# Patient Record
Sex: Female | Born: 1990 | Race: White | Hispanic: No | Marital: Married
Health system: Southern US, Community
[De-identification: ages and names within clinical notes are randomized; demographics above are authoritative.]

## PROBLEM LIST (undated history)

## (undated) DIAGNOSIS — N200 Calculus of kidney: Secondary | ICD-10-CM

## (undated) DIAGNOSIS — O09299 Supervision of pregnancy with other poor reproductive or obstetric history, unspecified trimester: Secondary | ICD-10-CM

## (undated) DIAGNOSIS — F419 Anxiety disorder, unspecified: Secondary | ICD-10-CM

## (undated) HISTORY — PX: TONSILLECTOMY: SUR1361

## (undated) HISTORY — DX: Calculus of kidney: N20.0

## (undated) HISTORY — DX: Anxiety disorder, unspecified: F41.9

---
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
## 2014-07-06 ENCOUNTER — Emergency Department (HOSPITAL_COMMUNITY)
Admission: EM | Admit: 2014-07-06 | Discharge: 2014-07-06 | Disposition: A | Discharge status: Home / Self Care | Payer: BC Managed Care – PPO | Attending: Emergency Medicine | Admitting: Emergency Medicine

## 2014-07-06 ENCOUNTER — Encounter (HOSPITAL_COMMUNITY): Payer: Self-pay

## 2014-07-06 ENCOUNTER — Emergency Department (HOSPITAL_COMMUNITY): Payer: BC Managed Care – PPO

## 2014-07-06 DIAGNOSIS — R109 Unspecified abdominal pain: Secondary | ICD-10-CM | POA: Diagnosis present

## 2014-07-06 DIAGNOSIS — N201 Calculus of ureter: Secondary | ICD-10-CM | POA: Diagnosis not present

## 2014-07-06 DIAGNOSIS — Z3202 Encounter for pregnancy test, result negative: Secondary | ICD-10-CM | POA: Diagnosis not present

## 2014-07-06 DIAGNOSIS — N23 Unspecified renal colic: Secondary | ICD-10-CM | POA: Insufficient documentation

## 2014-07-06 LAB — URINALYSIS, ROUTINE W REFLEX MICROSCOPIC
BILIRUBIN URINE: NEGATIVE
GLUCOSE, UA: NEGATIVE mg/dL
Hgb urine dipstick: NEGATIVE
KETONES UR: NEGATIVE mg/dL
LEUKOCYTES UA: NEGATIVE
NITRITE: NEGATIVE
PH: 7 (ref 5.0–8.0)
PROTEIN: NEGATIVE mg/dL
SPECIFIC GRAVITY, URINE: 1.025 (ref 1.005–1.030)
Urobilinogen, UA: 0.2 mg/dL (ref 0.0–1.0)

## 2014-07-06 LAB — PREGNANCY, URINE: Preg Test, Ur: NEGATIVE

## 2014-07-06 MED ORDER — NAPROXEN 250 MG PO TABS: 250.0000 mg | ORAL_TABLET | Freq: Two times a day (BID) | ORAL | Status: DC | PRN

## 2014-07-06 MED ORDER — HYDROCODONE-ACETAMINOPHEN 5-325 MG PO TABS
ORAL_TABLET | ORAL | Status: DC
Start: 2014-07-06 — End: 2017-07-16

## 2014-07-06 MED ORDER — TAMSULOSIN HCL 0.4 MG PO CAPS: 0.4000 mg | ORAL_CAPSULE | Freq: Every day | ORAL | Status: DC

## 2014-07-06 MED ORDER — ONDANSETRON HCL 4 MG PO TABS: 4.0000 mg | ORAL_TABLET | Freq: Three times a day (TID) | ORAL | Status: DC | PRN

## 2014-07-06 MED ORDER — HYDROCODONE-ACETAMINOPHEN 5-325 MG PO TABS
2.0000 | ORAL_TABLET | Freq: Once | ORAL | Status: AC
  Administered 2014-07-06: 2 via ORAL
  Filled 2014-07-06: qty 2

## 2014-07-06 NOTE — ED Notes (Signed)
MD at bedside. 

## 2014-07-06 NOTE — ED Notes (Signed)
Pt reporting pain in lower abdomen "like period cramps, but worse".  Reporting some pain in right flank as well.  Reporting urgency and frequency but doesn't feel as though she empties her bladder.  Reporting some mild nausea as well.

## 2014-07-06 NOTE — Discharge Instructions (Signed)
°Emergency Department Resource Guide °1) Find a Doctor and Pay Out of Pocket °Although you won't have to find out who is covered by your insurance plan, it is a good idea to ask around and get recommendations. You will then need to call the office and see if the doctor you have chosen will accept you as a new patient and what types of options they offer for patients who are self-pay. Some doctors offer discounts or will set up payment plans for their patients who do not have insurance, but you will need to ask so you aren't surprised when you get to your appointment. ° °2) Contact Your Local Health Department °Not all health departments have doctors that can see patients for sick visits, but many do, so it is worth a call to see if yours does. If you don't know where your local health department is, you can check in your phone book. The CDC also has a tool to help you locate your state's health department, and many state websites also have listings of all of their local health departments. ° °3) Find a Walk-in Clinic °If your illness is not likely to be very severe or complicated, you may want to try a walk in clinic. These are popping up all over the country in pharmacies, drugstores, and shopping centers. They're usually staffed by nurse practitioners or physician assistants that have been trained to treat common illnesses and complaints. They're usually fairly quick and inexpensive. However, if you have serious medical issues or chronic medical problems, these are probably not your best option. ° °No Primary Care Doctor: °- Call [facility] at  832-8000 - they can help you locate a primary care doctor that  accepts your insurance, provides certain services, etc. °- Physician Referral Service- 1-800-533-3463 ° °Chronic Pain Problems: °Organization         Address  Phone   Notes  °[facility]  (336) 297-2271 Patients need to be referred by their primary care doctor.  ° °Medication  Assistance: °Organization         Address  Phone   Notes  °[place] [facility] [address] °[place], [place] [address] (336) 641-8030 --Must be a resident of [place] °-- Must have NO insurance coverage whatsoever (no Medicaid/ Medicare, etc.) °-- The pt. MUST have a primary care doctor that directs their care regularly and follows them in the community °  °[facility]  (866) 331-1348   °[employer]  (888) 892-1162   ° °Agencies that provide inexpensive medical care: °Organization         Address  Phone   Notes  °[facility]  (336) 832-8035   °[facility]    (336) 832-7272   °[facility] [address] °[place], [place] [address] (336) 832-4777   °[facility] [address], °[place] (336) 271-4999   °[facility]    (336) 373-0678   °[facility]    (336) 272-1050   °[facility] ° [address], [place] Phone:  (336) 832-4444, Fax:  (336) 832-4440 Hours of Operation:  9 am - 6 pm, M-F.  Also accepts Medicaid/Medicare and self-pay.  °[facility] ° [address], [place] Phone: (336) 832-3150, Fax: (336) 832-3151. Hours of Operation:  8:30 am - 5:30 pm, M-F.  Also accepts Medicaid and self-pay.  °[facility] [place] [address], [place] Phone: (336) 878-6027   °[facility] [address], [place], [place] (336)723-1848, Ext. 123 Mondays & Thursdays: 7-9 AM.  First 15 patients are seen on a first come, first serve basis. °  ° °Medicaid-accepting [place] Providers: ° °Organization         Address  Phone   Notes  °[facility] [address], [place] (336) 641-2100 Also accepts self-pay patients.  °[facility] [address], [place] ° (336) 856-9996   °[facility] [address], [place]  (336) 288-8857   °[facility] [address] [place] [address], [place] (336) 299-7000   °Veita Bland [address], [place]  ° (336) 373-1557 Only accepts [place] Access Medicaid patients after they have their name applied to their card.  ° °Self-Pay (no insurance) in [place]: ° °Organization         Address  Phone   Notes  °Sickle Cell Patients, [facility] [address], [place] (336) 832-1970   °[facility] [address], [place] (336) 832-4400   °[facility] ° [address], [place] (336) 992-4800   °[facility]/Dr. Osei-Bonsu ° [address] [place] [address], [place] or [address], [place] (336) 841-8500 Phone number for both [place] and [place] locations is the same.  °[facility] [address], [place] (336) 299-0000   °[facility] [address] [place] [address], [place] or [address] (336) 852-7530 °(336) 878-2260   °[facility] [address], [place] (336) 350-1642, phone; (336) 294-5005, fax Sees patients 1st and 3rd Saturday of every month.  Must not qualify for public or private insurance (i.e. Medicaid, Medicare, [place] Health Choice, Veterans' Benefits) • Household income should be no more than 200% of the poverty level •The clinic cannot treat you if you are pregnant or think you are pregnant • Sexually transmitted diseases are not treated at the clinic.  ° ° °Dental Care: °Organization         Address  Phone  Notes  °[place] [facility] [address], [place] (336) 641-6152 Accepts children up to age 21 who are enrolled in Medicaid or [place] Health Choice; pregnant women with a Medicaid card; and children who have applied for Medicaid or [place] Health Choice, but were declined, whose parents can pay a reduced fee at time of service.  °[place]  [facility] [place]  [address], [place] (336) 641-7733 Accepts children up to age 21 who are enrolled in Medicaid or [place] Health Choice; pregnant women with a Medicaid card; and children who have applied for Medicaid or [place] Health Choice, but were declined, whose parents can pay a reduced fee at time of service.  °[facility] ° [address], [place] (336) 641-4533 Patients are seen by appointment only. Walk-ins are not accepted. [facility] will see patients 18 years of age and older. °Monday - Tuesday (8am-5pm) °Most Wednesdays (8:30-5pm) °$30 per visit, cash only  °[facility] ° [address], [place] (336) 641-4533 Patients are seen by appointment only. Walk-ins are not accepted. [facility] will see patients 18 years of age and older. °One   Wednesday Evening (Monthly: Volunteer Based).  $30 per visit, cash only  °[facility]  (919) 537-3737 for adults; Children under age 4, call Graduate Pediatric Dentistry at ([phone]) [phone]. Children aged 4-14, please call (919) [phone] to request a pediatric application. ° Dental services are provided in all areas of dental care including fillings, crowns and bridges, complete and partial dentures, implants, gum treatment, root canals, and extractions. Preventive care is also provided. Treatment is provided to both adults and children. °Patients are selected via a lottery and there is often a waiting list. °  °[facility] [address], °[place] ° (336) 763-8833 [email or web address] °  °[facility] [address], [place], [place] (336)723-1848, Ext. 123 Second and Fourth Thursday of each month, opens at 6:30 AM; Clinic ends at 9 AM.  Patients are seen on a first-come first-served basis, and a limited number are seen during each clinic.  ° °[facility] ° [address], [place], [place] (336) 723-7904    Eligibility Requirements °You must have lived in [place], or [place] counties for at least the last three months. °  You cannot be eligible for state or federal sponsored healthcare insurance, including Veterans Administration, Medicaid, or Medicare. °  You generally cannot be eligible for healthcare insurance through your employer.  °  How to apply: °Eligibility screenings are held every Tuesday and Wednesday afternoon from 1:00 pm until 4:00 pm. You do not need an appointment for the interview!  °[facility] [address], [place], [place] 336-631-2330   °[facility]  336-342-8273   °[facility]  336-703-3100   °[facility]  336-570-6415   ° °Behavioral Health Resources in the Community: °Intensive Outpatient Programs °Organization         Address  Phone  Notes  °[facility] [address], [place], [place] 336-878-6098   °[facility] [address], [place], [place] 336-832-9800   °[facility] [address], [place], [place] ° 336-882-2125   °[facility] [address],  °[place], [place] 1-800-853-5163 or 336-641-4981   °Substance Abuse Resources °Organization         Address  Phone  Notes  °[facility]  336-882-2125   °[facility]  336-784-9470   °[facility]  336-285-9073   °[facility]  336-845-3988   °Residential & Outpatient Substance Abuse Program  1-800-659-3381   °Psychological Services °Organization         Address  Phone  Notes  °[facility]  336- 832-9600   °[facility]  336- 378-7881   °[facility] [address], [place] 1-800-853-5163 or 336-641-4981   ° °Mobile Crisis Teams °Organization         Address  Phone  Notes  °[facility], Mobile Crisis Care Unit  1-877-626-1772   °Assertive °[facility] ° [address]  [place], [place] 336-834-9664   °Sharon DeEsch [address] °[place] 336-554-5454   ° °Self-Help/Support Groups °Organization         Address  Phone             Notes  °[facility]  - variety of support groups  336- 373-1402 Call for more information  °Narcotics Anonymous (NA), [facility] [address], °[place]  2 meetings at this location  ° °  Residential Treatment Programs °Organization         Address  Phone  Notes  °[facility] [address],    °[place]  1-866-801-8205   °[facility] ° [address], [place], [place] 704-293-8524   °[facility] [address], [place] 336-845-3988 Admissions: 8am-3pm M-F  °[facility] [address]    °[place], [place] 336-841-1104   °[facility] [address], [place], [place] 336-379-7146   °[facility] [address]  °[place], [place] 336-285-9073   °[facility] Intensive Outpatient [address], [place], [place] 336-852-3033   °[facility] [address]  °[place], [place] 1-877-615-2722 or 336-784-9470   °[facility] [address] [place], [place] 336-227-7417 Accepts Medicaid  °[facility] [address]  °[place] 1-800-659-3381 Substance Abuse/Addiction Treatment  ° °[place] Behavioral Health Resources °Organization         Address  Phone  Notes  °[facility]  (888) 581-9988   °Julie Brannon, PhD [address] [place], [place]   (336) 349-5553 or (336) 951-0000   °[facility]   [address] °[place], [place] (336) 349-4454   °[facility] [address], [place], [place] (336) 342-8316 Insurance/Medicaid/sponsorship through [facility]  °[facility] [address]                                    [place], [place] (336) 342-8316 Therapy/tele-psych/case    °[facility] [address]  ° [place], [place] (336) 349-2233    °Dr. Arfeen  (336) 349-4544   °[facility] [place]  [employer] [place] [facility] 1) [address], [place] °2) [address], [place] °3)  [address], [place] (336) 349-3220 °(336) 342-7768 ° °(336) 342-8140   °[place] [facility] (336) 342-1394 or (336) 342-3537 (After Hours)    ° ° ° °Take the prescriptions as directed.  Call the Urologist today to schedule a follow up appointment within the next week.  Return to the Emergency Department immediately if worsening. ° °

## 2014-07-06 NOTE — ED Provider Notes (Signed)
CSN: [id number]     Arrival date & time 07/06/14  [id number] History   First MD Initiated Contact with Patient 07/06/14 [phone]     Chief Complaint  Patient presents with  . Abdominal Pain  . Flank Pain     HPI Pt was seen at 0720. Per pt, c/o sudden onset and persistence of constant right sided flank "pain" that began at 0200 overnight last night.  Pt describes the pain as "sharp," "aching," and radiating into the right side of her abd.  Has been associated with urinary frequency.  Denies vaginal bleeding/discharge, no dysuria/hematuria, no abd pain, no diarrhea, no black or blood in emesis, no CP/SOB.     History reviewed. No pertinent past medical history.   Past Surgical History  Procedure Laterality Date  . Tonsillectomy      History  Substance Use Topics  . Smoking status: Never Smoker   . Smokeless tobacco: Not on file  . Alcohol Use: No    Review of Systems ROS: Statement: All systems negative except as marked or noted in the HPI; Constitutional: Negative for fever and chills. ; ; Eyes: Negative for eye pain, redness and discharge. ; ; ENMT: Negative for ear pain, hoarseness, nasal congestion, sinus pressure and sore throat. ; ; Cardiovascular: Negative for chest pain, palpitations, diaphoresis, dyspnea and peripheral edema. ; ; Respiratory: Negative for cough, wheezing and stridor. ; ; Gastrointestinal: Negative for nausea, vomiting, diarrhea, abdominal pain, blood in stool, hematemesis, jaundice and rectal bleeding. . ; ; Genitourinary: +flank pain, urinary frequency. Negative for dysuria and hematuria. ; ; GYN:  No vaginal bleeding, no vaginal discharge, no vulvar pain. ;; Musculoskeletal: Negative for back pain and neck pain. Negative for swelling and trauma.; ; Skin: Negative for pruritus, rash, abrasions, blisters, bruising and skin lesion.; ; Neuro: Negative for headache, lightheadedness and neck stiffness. Negative for weakness, altered level of consciousness , altered mental  status, extremity weakness, paresthesias, involuntary movement, seizure and syncope.      Allergies  Prednisone and Pseudophed-chlophedianol-gg  Home Medications   Prior to Admission medications   Not on File   BP 125/68 mmHg  Pulse 92  Temp(Src) 97.5 F (36.4 C) (Oral)  Resp 20  Ht 5\' 2"  (1.575 m)  Wt 160 lb (72.576 kg)  BMI 29.26 kg/m2  SpO2 100%  LMP 06/26/2014 Physical Exam 0725: Physical examination:  Nursing notes reviewed; Vital signs and O2 SAT reviewed;  Constitutional: Well developed, Well nourished, Well hydrated, In no acute distress; Head:  Normocephalic, atraumatic; Eyes: EOMI, PERRL, No scleral icterus; ENMT: Mouth and pharynx normal, Mucous membranes moist; Neck: Supple, Full range of motion, No lymphadenopathy; Cardiovascular: Regular rate and rhythm, No murmur, rub, or gallop; Respiratory: Breath sounds clear & equal bilaterally, No rales, rhonchi, wheezes.  Speaking full sentences with ease, Normal respiratory effort/excursion; Chest: Nontender, Movement normal; Abdomen: Soft, Nontender, Nondistended, Normal bowel sounds; Genitourinary: No CVA tenderness; Spine:  No midline CS, TS, LS tenderness. +mild TTP right lumbar paraspinal muscles.;; Extremities: Pulses normal, No tenderness, No edema, No calf edema or asymmetry.; Neuro: AA&Ox3, Major CN grossly intact.  Speech clear. No gross focal motor or sensory deficits in extremities.; Skin: Color normal, Warm, Dry.   ED Course  Procedures     EKG Interpretation None      MDM  MDM Reviewed: previous chart, nursing note and vitals Interpretation: labs and CT scan    Results for orders placed or performed during the hospital encounter of 07/06/14  Urinalysis, Routine  w reflex microscopic  Result Value Ref Range   Color, Urine YELLOW YELLOW   APPearance CLEAR CLEAR   Specific Gravity, Urine 1.025 1.005 - 1.030   pH 7.0 5.0 - 8.0   Glucose, UA NEGATIVE NEGATIVE mg/dL   Hgb urine dipstick NEGATIVE NEGATIVE    Bilirubin Urine NEGATIVE NEGATIVE   Ketones, ur NEGATIVE NEGATIVE mg/dL   Protein, ur NEGATIVE NEGATIVE mg/dL   Urobilinogen, UA 0.2 0.0 - 1.0 mg/dL   Nitrite NEGATIVE NEGATIVE   Leukocytes, UA NEGATIVE NEGATIVE  Pregnancy, urine  Result Value Ref Range   Preg Test, Ur NEGATIVE NEGATIVE   Ct Renal Stone Study 07/06/2014   CLINICAL DATA:  24 year old female with right flank pain since this morning. Initial encounter.  EXAM: CT ABDOMEN AND PELVIS WITHOUT CONTRAST  TECHNIQUE: Multidetector CT imaging of the abdomen and pelvis was performed following the standard protocol without IV contrast.  COMPARISON:  None.  FINDINGS: 2.6 mm stone at the right ureterovesical junction or just above the right ureterovesical junction causing moderate right-sided hydroureteronephrosis.  Although the distal aspect of the appendix is prominent in size, no extra luminal inflammatory process noted.  No free intraperitoneal air or free fluid.  Tiny hyperdensity anterior cortex of the left kidney may be artifact or possibly a tiny hyperdense cysts. No left-sided obstructing stone or hydronephrosis.  Taking into account limitation by non contrast imaging, no worrisome hepatic, splenic, pancreatic or adrenal abnormality. No calcified gallstones.  Lung bases are clear.  No osseous abnormality.  Uterus and adnexa unremarkable for patient's age.  No abdominal aortic aneurysm.  No adenopathy.  IMPRESSION: 2.6 mm stone at the right ureterovesical junction (or just above the right ureterovesical junction) causing moderate right-sided hydroureteronephrosis.  Although the distal aspect of the appendix is prominent in size, no extra luminal inflammatory process noted.  Tiny hyperdensity anterior cortex of the left kidney may be artifact or possibly a tiny hyperdense cysts. No left-sided obstructing stone or hydronephrosis.  Please see above.   Electronically Signed   By: Bridgett Larsson M.D.   On: 07/06/2014 08:21    0900:  Improved after  meds. Pt wants to go home now. Tol PO well while in the ED without N/V. Dx and testing d/w pt and family.  Questions answered.  Verb understanding, agreeable to d/c home with outpt f/u.   Samuel Jester, DO 07/10/14 [phone]

## 2014-07-06 NOTE — ED Notes (Signed)
Patient would like something for pain. RN aware.

## 2014-07-06 NOTE — ED Notes (Signed)
Pt  Reports pain in right side/flank/abd onset 2 am, states it feels like the last time when she had a kidney infection

## 2015-09-30 ENCOUNTER — Other Ambulatory Visit (HOSPITAL_COMMUNITY): Payer: Self-pay | Admitting: *Deleted

## 2015-09-30 ENCOUNTER — Other Ambulatory Visit (HOSPITAL_COMMUNITY): Payer: Self-pay | Admitting: Nurse Practitioner

## 2015-09-30 DIAGNOSIS — E049 Nontoxic goiter, unspecified: Secondary | ICD-10-CM

## 2015-10-06 ENCOUNTER — Ambulatory Visit (HOSPITAL_COMMUNITY)
Admission: RE | Admit: 2015-10-06 | Discharge: 2015-10-06 | Disposition: A | Discharge status: Home / Self Care | Payer: BC Managed Care – PPO | Source: Ambulatory Visit | Attending: Nurse Practitioner | Admitting: Nurse Practitioner

## 2015-10-06 DIAGNOSIS — E049 Nontoxic goiter, unspecified: Secondary | ICD-10-CM | POA: Diagnosis present

## 2015-10-06 DIAGNOSIS — E042 Nontoxic multinodular goiter: Secondary | ICD-10-CM | POA: Diagnosis not present

## 2017-07-16 ENCOUNTER — Ambulatory Visit: Payer: BC Managed Care – PPO | Admitting: Certified Nurse Midwife

## 2017-07-16 ENCOUNTER — Encounter: Payer: Self-pay | Admitting: Certified Nurse Midwife

## 2017-07-16 VITALS — BP 119/64 | HR 86 | Ht 62.0 in | Wt 195.6 lb

## 2017-07-16 DIAGNOSIS — N946 Dysmenorrhea, unspecified: Secondary | ICD-10-CM | POA: Insufficient documentation

## 2017-07-16 DIAGNOSIS — Z30011 Encounter for initial prescription of contraceptive pills: Secondary | ICD-10-CM

## 2017-07-16 LAB — POCT URINE PREGNANCY: Preg Test, Ur: NEGATIVE

## 2017-07-16 MED ORDER — NORGESTIM-ETH ESTRAD TRIPHASIC 0.18/0.215/0.25 MG-35 MCG PO TABS: 1.0000 | ORAL_TABLET | Freq: Every day | ORAL | 11 refills | Status: DC

## 2017-07-16 NOTE — Progress Notes (Signed)
New pt is here with c/o very painful periods resulting in lightheadedness that is affecting work. Also c/o anxiety. 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 Pt has never had a GYN exam. Is not using birth control.

## 2017-07-16 NOTE — Patient Instructions (Signed)
Oral Contraception Use Oral contraceptive pills (OCPs) are medicines taken to prevent pregnancy. OCPs work by preventing the ovaries from releasing eggs. The hormones in OCPs also cause the cervical mucus to thicken, preventing the sperm from entering the uterus. The hormones also cause the uterine lining to become thin, not allowing a fertilized egg to attach to the inside of the uterus. OCPs are highly effective when taken exactly as prescribed. However, OCPs do not prevent sexually transmitted diseases (STDs). Safe sex practices, such as using condoms along with an OCP, can help prevent STDs. Before taking OCPs, you may have a physical exam and Pap test. Your health care provider may also order blood tests if necessary. Your health care provider will make sure you are a good candidate for oral contraception. Discuss with your health care provider the possible side effects of the OCP you may be prescribed. When starting an OCP, it can take 2 to 3 months for the body to adjust to the changes in hormone levels in your body. How to take oral contraceptive pills Your health care provider may advise you on how to start taking the first cycle of OCPs. Otherwise, you can:  Start on day 1 of your menstrual period. You will not need any backup contraceptive protection with this start time.  Start on the first Sunday after your menstrual period or the day you get your prescription. In these cases, you will need to use backup contraceptive protection for the first week.  Start the pill at any time of your cycle. If you take the pill within 5 days of the start of your period, you are protected against pregnancy right away. In this case, you will not need a backup form of birth control. If you start at any other time of your menstrual cycle, you will need to use another form of birth control for 7 days. If your OCP is the type called a minipill, it will protect you from pregnancy after taking it for 2 days (48  hours).  After you have started taking OCPs:  If you forget to take 1 pill, take it as soon as you remember. Take the next pill at the regular time.  If you miss 2 or more pills, call your health care provider because different pills have different instructions for missed doses. Use backup birth control until your next menstrual period starts.  If you use a 28-day pack that contains inactive pills and you miss 1 of the last 7 pills (pills with no hormones), it will not matter. Throw away the rest of the non-hormone pills and start a new pill pack.  No matter which day you start the OCP, you will always start a new pack on that same day of the week. Have an extra pack of OCPs and a backup contraceptive method available in case you miss some pills or lose your OCP pack. Follow these instructions at home:  Do not smoke.  Always use a condom to protect against STDs. OCPs do not protect against STDs.  Use a calendar to mark your menstrual period days.  Read the information and directions that came with your OCP. Talk to your health care provider if you have questions. Contact a health care provider if:  You develop nausea and vomiting.  You have abnormal vaginal discharge or bleeding.  You develop a rash.  You miss your menstrual period.  You are losing your hair.  You need treatment for mood swings or depression.  You   get dizzy when taking the OCP.  You develop acne from taking the OCP.  You become pregnant. Get help right away if:  You develop chest pain.  You develop shortness of breath.  You have an uncontrolled or severe headache.  You develop numbness or slurred speech.  You develop visual problems.  You develop pain, redness, and swelling in the legs. This information is not intended to replace advice given to you by your health care provider. Make sure you discuss any questions you have with your health care provider. Document Released: 05/17/2011 Document  Revised: 11/03/2015 Document Reviewed: 11/16/2012 Elsevier Interactive Patient Education  2017 [employer]  

## 2017-07-16 NOTE — Progress Notes (Signed)
GYN ENCOUNTER NOTE  Subjective:       Janet Norman is a 27 y.o. G0P0000 female is here for gynecologic evaluation of the following issues:  1. She complains of painful periods. Her cycles are typically 35 days. Lasting 4-5 days. She has moderate bleeding changing her pad every 2 hours with occasoinally passing pea size clots. She states that a few days before her cycle starts she has tender swollen breast, she gets light headed/dizzy and has significant pain until day 2. She treats the pain with 800 mg of ibuprofen which usually works. She states that she has always had this problem and took birth control pills in middle school to help control symptoms. It worked well. She stopped taking them due to indigestion and started depo injections which she did not like. She has been off of birth control for 4 yrs.     Gynecologic History Patient's last menstrual period was 06/14/2017 (exact date). Contraception: none Last Pap: has not had one.   Obstetric History OB History  Gravida Para Term Preterm AB Living  0 0 0 0 0 0  SAB TAB Ectopic Multiple Live Births  0 0 0 0 0        No past medical history on file.  Past Surgical History:  Procedure Laterality Date  . TONSILLECTOMY      No current outpatient medications on file prior to visit.   No current facility-administered medications on file prior to visit.     Allergies  Allergen Reactions  . Red Dye   . Prednisone Hives and Rash  . Pseudophed-Chlophedianol-Gg Swelling and Rash    Social History   Socioeconomic History  . Marital status: Single    Spouse name: Not on file  . Number of children: Not on file  . Years of education: Not on file  . Highest education level: Not on file  Social Needs  . Financial resource strain: Not on file  . Food insecurity - worry: Not on file  . Food insecurity - inability: Not on file  . Transportation needs - medical: Not on file  . Transportation needs - non-medical: Not on file   Occupational History  . Not on file  Tobacco Use  . Smoking status: Never Smoker  . Smokeless tobacco: Never Used  Substance and Sexual Activity  . Alcohol use: No  . Drug use: No  . Sexual activity: Yes    Birth control/protection: None  Other Topics Concern  . Not on file  Social History Narrative  . Not on file    Family History  Problem Relation Age of Onset  . Diabetes Mother   . Cancer Maternal Grandmother    Denies history of endometriosis for mother or siblings   The following portions of the patient's history were reviewed and updated as appropriate: allergies, current medications, past family history, past medical history, past social history, past surgical history and problem list.  Review of Systems Review of Systems - Negative except as mentioned in HPI Review of Systems - General ROS: negative for - chills, fatigue, fever, hot flashes, malaise or night sweats. Positive for dizziness with cycle Hematological and Lymphatic ROS: negative for - bleeding problems or swollen lymph nodes Gastrointestinal ROS: negative for - abdominal pain, blood in stools, change in bowel habits and nausea/vomiting. Positive for change in bowel habits during menstrual cycle.  Musculoskeletal ROS: negative for - joint pain, muscle pain or muscular weakness Genito-Urinary ROS: negative for - change in menstrual  cycle, dyspareunia, dysuria, genital discharge, genital ulcers, hematuria, incontinence, irregular menses, nocturia or pelvic pain. Positive for dysmenorrhea and moderate to heavy bleeding.   Objective:   BP 119/64   Pulse 86   Ht 5\' 2"  (1.575 m)   Wt 195 lb 9 oz (88.7 kg)   LMP 06/14/2017 (Exact Date)   BMI 35.77 kg/m  CONSTITUTIONAL: Well-developed, well-nourished female in no acute distress.  HENT:  Normocephalic, atraumatic.  NECK: Normal range of motion, supple.  SKIN: Skin is warm and dry. No rash noted. Not diaphoretic. No erythema. No pallor. NEUROLGIC: Alert and  oriented to person, place, and time. PSYCHIATRIC: Normal mood and affect. Normal behavior. Normal judgment and thought content. CARDIOVASCULAR:Not Examined RESPIRATORY: Not Examined BREASTS: Not Examined ABDOMEN: Soft, non distended; Non tender.  No Organomegaly. PELVIC:pt declines MUSCULOSKELETAL: Normal range of motion. No tenderness.  No cyanosis, clubbing, or edema.   Assessment:   Ddysmenorrhea Plan:   Pt declines full work up of labs and ultrasound at this time. She is more interested in treating the symptoms with birth control pills. She denies any contraindications for use of the pill. Order placed for tri-sprintec. Discussed use. She verbalizes understanding and agrees to plan. Follow up PRN.   I attest more than 50% of visit spent reviewing patient history, discussing options for full work up to evaluate for endometriosis, fibroids, discussed labs and ultrasound and use of birth control for treating symptoms. Face to face time 15 minutes.   Doreene BurkeAnnie Kaelyn Nauta, CNM

## 2017-07-17 ENCOUNTER — Encounter: Payer: Self-pay | Admitting: Certified Nurse Midwife

## 2017-11-27 IMAGING — US US SOFT TISSUE HEAD/NECK
1 series · 14 of 25 positions shown · non-contrast
Comparison: None.

CLINICAL DATA: Goiter 5 physical exam

EXAM:
THYROID ULTRASOUND
TECHNIQUE: Ultrasound examination of the thyroid gland and adjacent soft
tissues was performed.

[Series 1: us soft tissue head/neck · 0.06mm/px · 14 of 59 slices shown]
[im 1/59]
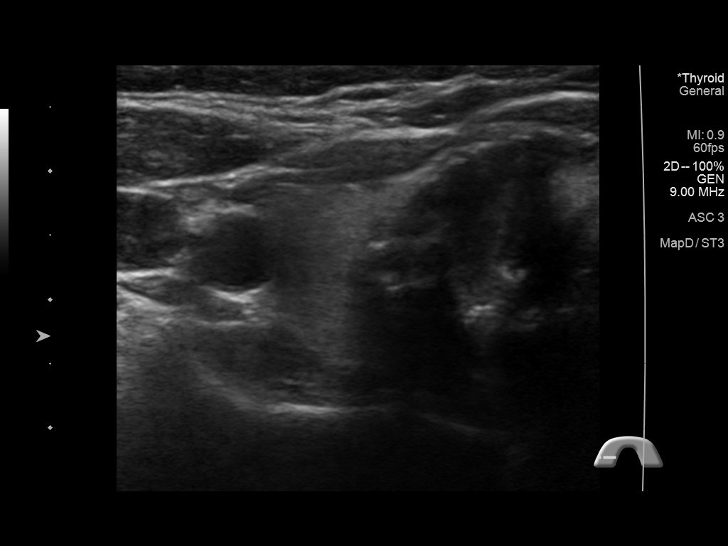
[im 5/59]
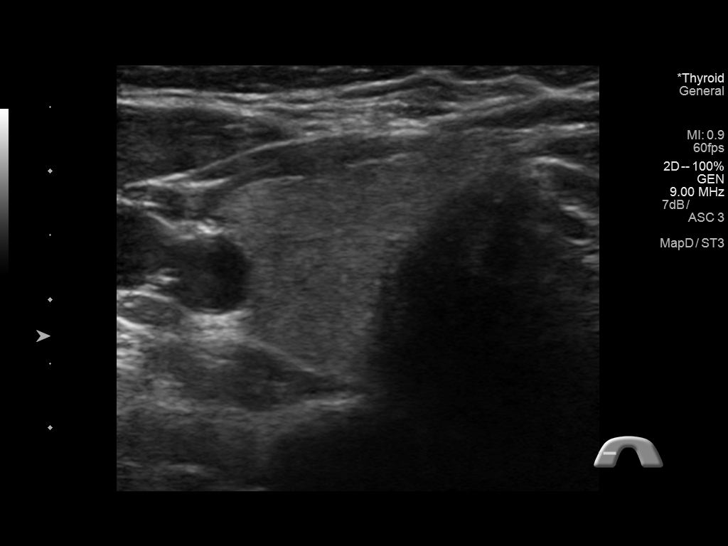
[im 10/59]
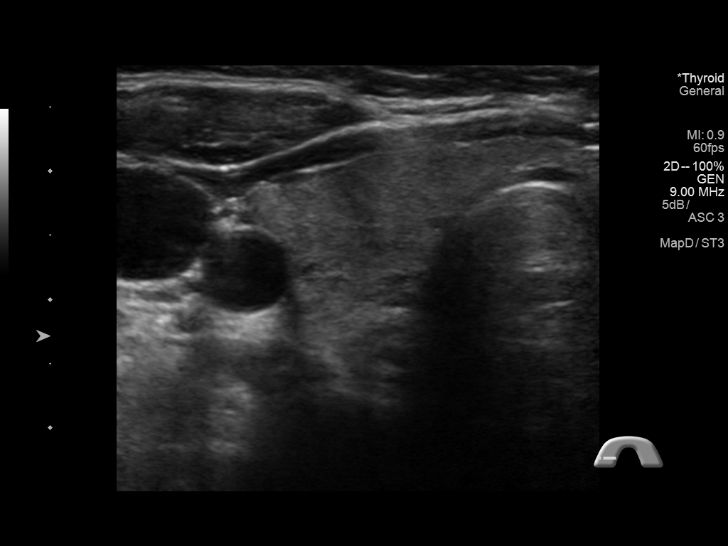
[im 15/59]
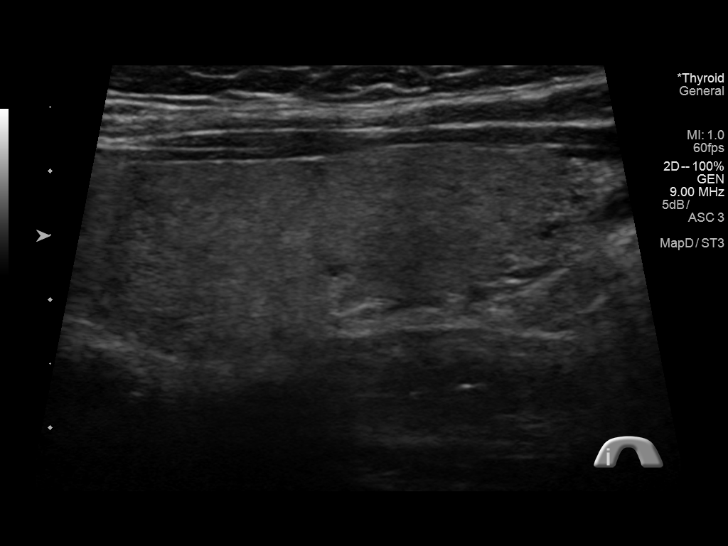
[im 20/59]
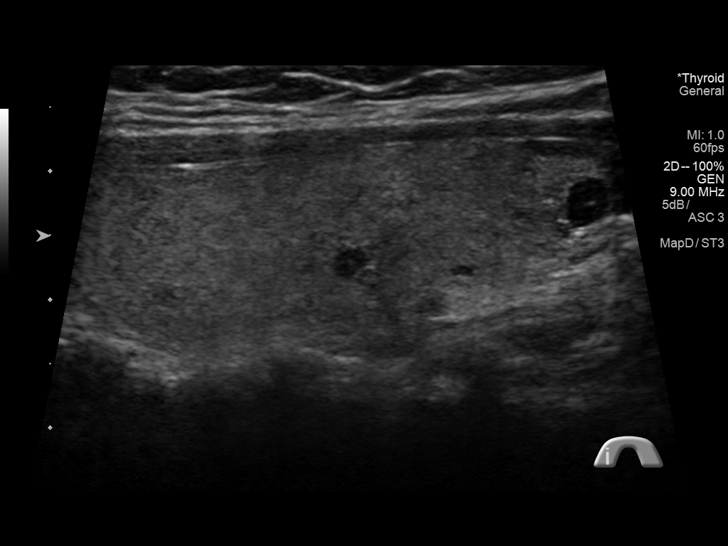
[im 22/59]
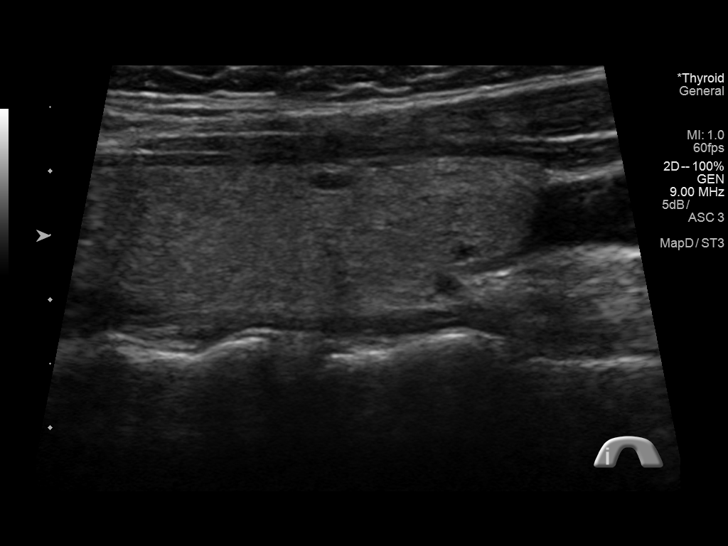
[im 27/59]
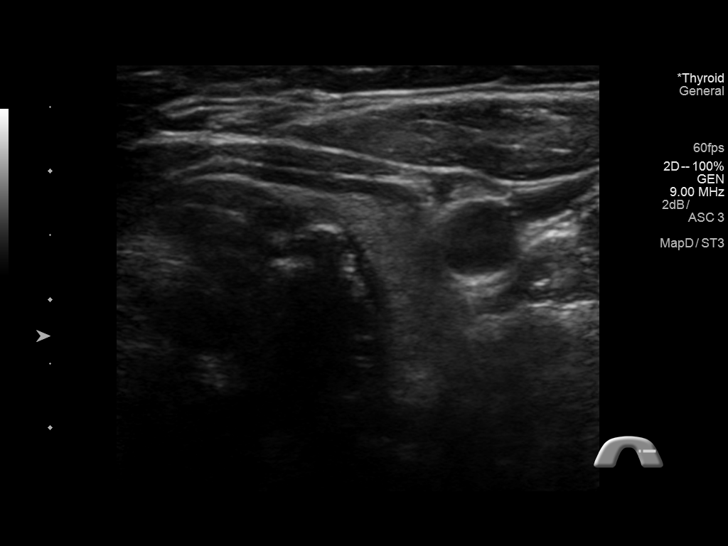
[im 32/59]
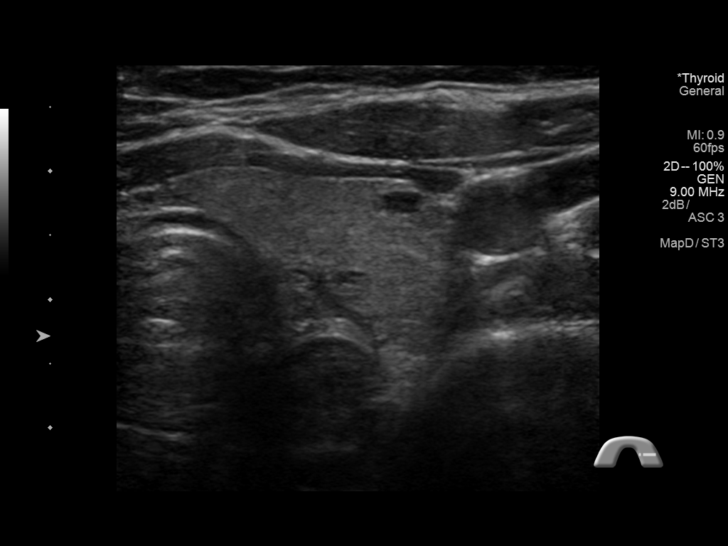
[im 37/59]
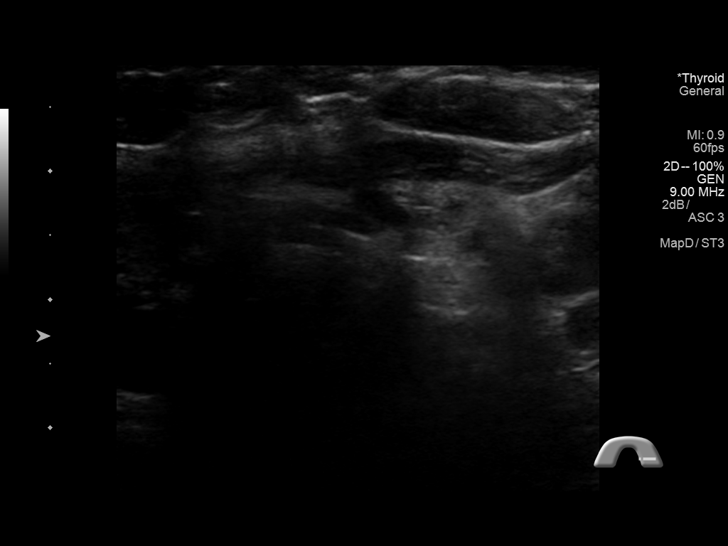
[im 39/59]
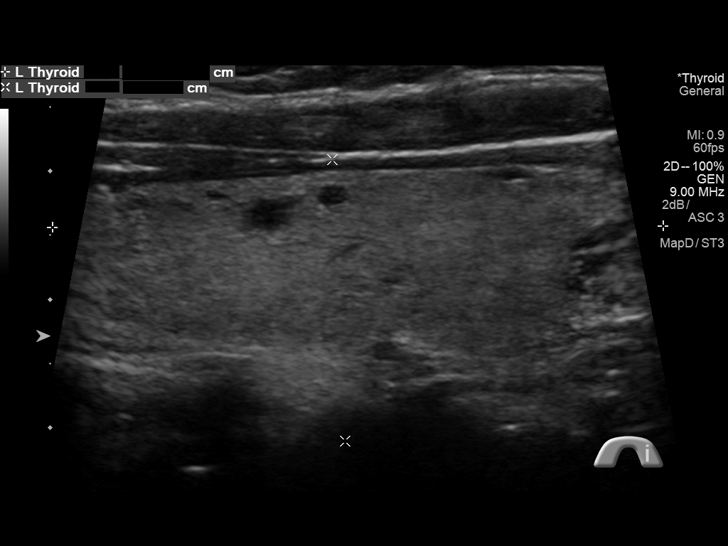
[im 44/59]
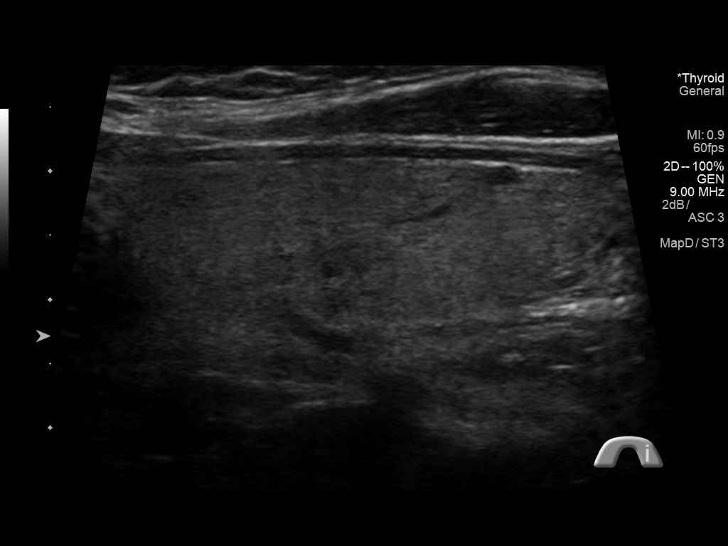
[im 49/59]
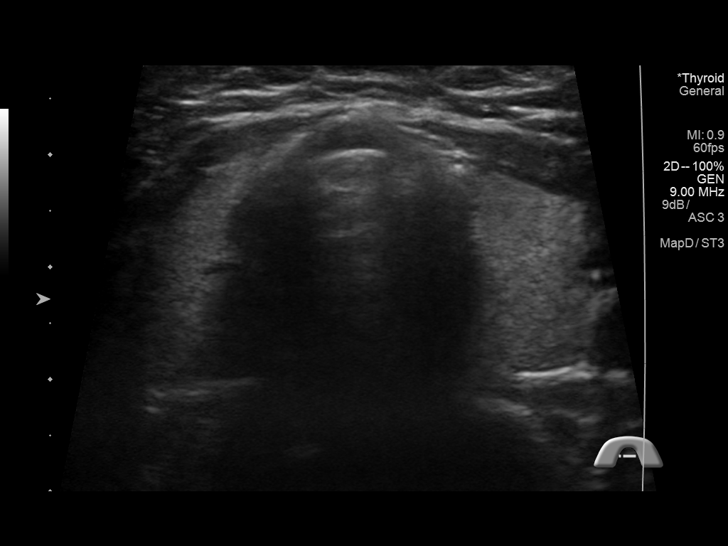
[im 54/59]
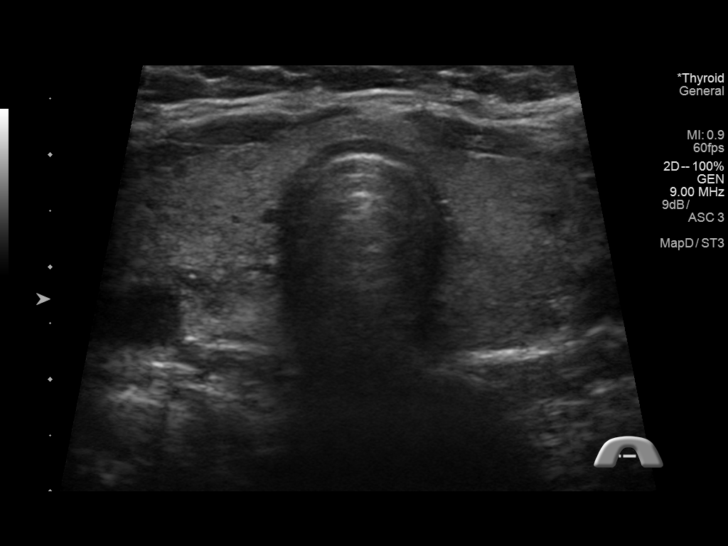
[im 59/59]
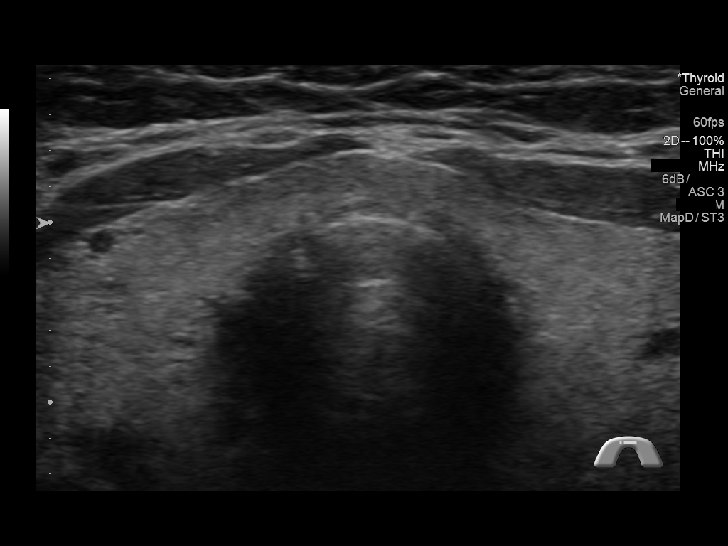

[14 of 25 positions shown; findings below may reference images not displayed]

FINDINGS: Right thyroid lobe

Measurements: 4.6 x 1.7 x 2.2 cm. Ill-defined lower pole new solid
nodule measures 1.3 x 0.5 x 0.5 cm. Heterogeneous tissue in the
gland.

Left thyroid lobe

Measurements: 4.8 x 2.2 x 1.9 cm. Heterogeneous tissue with tiny
hypoechoic nodules.

Isthmus

Thickness: 4 mm.  No nodules visualized.

Lymphadenopathy

None visualized.
IMPRESSION: Bilateral nodules. Dominant right lower pole nodule measures up to
1.3 cm. Findings do not meet current SRU consensus criteria for
biopsy. Follow-up by clinical exam is recommended. If patient has
known risk factors for thyroid carcinoma, consider follow-up
ultrasound in 12 months. If patient is clinically hyperthyroid,
consider nuclear medicine thyroid uptake and scan.Reference:
Management of Thyroid Nodules Detected at US: Society of
Radiologists in Ultrasound Consensus Conference Statement. Radiology

## 2020-05-20 LAB — OB RESULTS CONSOLE RPR: RPR: NONREACTIVE

## 2020-05-20 LAB — OB RESULTS CONSOLE RUBELLA ANTIBODY, IGM: Rubella: IMMUNE

## 2020-05-20 LAB — OB RESULTS CONSOLE HEPATITIS B SURFACE ANTIGEN: Hepatitis B Surface Ag: NEGATIVE

## 2020-05-20 LAB — OB RESULTS CONSOLE HIV ANTIBODY (ROUTINE TESTING): HIV: NONREACTIVE

## 2020-10-21 ENCOUNTER — Other Ambulatory Visit: Payer: Self-pay | Admitting: Obstetrics and Gynecology

## 2020-10-21 DIAGNOSIS — Z363 Encounter for antenatal screening for malformations: Secondary | ICD-10-CM

## 2020-10-21 DIAGNOSIS — O368999 Maternal care for other specified fetal problems, unspecified trimester, other fetus: Secondary | ICD-10-CM

## 2020-10-24 ENCOUNTER — Encounter: Payer: Self-pay | Admitting: Certified Nurse Midwife

## 2020-10-25 ENCOUNTER — Encounter: Payer: Self-pay | Admitting: *Deleted

## 2020-10-25 ENCOUNTER — Other Ambulatory Visit: Payer: Self-pay

## 2020-10-25 ENCOUNTER — Other Ambulatory Visit: Payer: Self-pay | Admitting: Obstetrics and Gynecology

## 2020-10-25 ENCOUNTER — Ambulatory Visit: Payer: BC Managed Care – PPO | Attending: Obstetrics and Gynecology

## 2020-10-25 ENCOUNTER — Ambulatory Visit: Payer: BC Managed Care – PPO | Admitting: *Deleted

## 2020-10-25 VITALS — BP 129/86 | HR 79

## 2020-10-25 DIAGNOSIS — Z3A33 33 weeks gestation of pregnancy: Secondary | ICD-10-CM | POA: Diagnosis not present

## 2020-10-25 DIAGNOSIS — Z363 Encounter for antenatal screening for malformations: Secondary | ICD-10-CM

## 2020-10-25 DIAGNOSIS — O09899 Supervision of other high risk pregnancies, unspecified trimester: Secondary | ICD-10-CM | POA: Diagnosis not present

## 2020-10-25 DIAGNOSIS — O359XX0 Maternal care for (suspected) fetal abnormality and damage, unspecified, not applicable or unspecified: Secondary | ICD-10-CM

## 2020-10-25 DIAGNOSIS — O368999 Maternal care for other specified fetal problems, unspecified trimester, other fetus: Secondary | ICD-10-CM | POA: Diagnosis not present

## 2020-10-25 DIAGNOSIS — O365931 Maternal care for other known or suspected poor fetal growth, third trimester, fetus 1: Secondary | ICD-10-CM

## 2020-10-25 NOTE — Progress Notes (Unsigned)
MFM Note  Janet Norman was seen for an ultrasound exam today due to maternal obesity, IUGR, pyelectasis, and a fetus with a two-vessel cord that was noted on ultrasounds performed in your office.  Due to a fetus with a two-vessel cord, she had a normal fetal echocardiogram performed with [facility] pediatric cardiology.  She denies any significant past medical history and reports that she has screened negative for gestational diabetes in her current pregnancy.  She had a cell free DNA test drawn earlier in her pregnancy that showed a low risk for trisomy 38, 27, 13.  A female fetus is predicted.   On today's exam, the EFW measures at the 4th percentile for her gestational age indicating fetal growth restriction.  There was normal amniotic fluid noted.    A biophysical profile performed today due to fetal growth restriction was 8 out of 8.    Doppler studies of the umbilical arteries performed today due to fetal growth restriction showed a normal S/D ratio of 2.34.  There were no signs of absent or reversed end-diastolic flow.    A two-vessel umbilical cord was noted on today's exam.    Right hydronephrosis with the renal pelvis measuring 1.7 cm dilated noted today.  The left fetal kidney appeared within normal limits.  The implications and management of hydronephrosis was discussed with the patient. She was advised that renal pelvis dilatation noted on prenatal ultrasounds will often resolve spontaneously after birth.  However given the degree of renal pelvis dilatation noted today, it is unlikely this will resolve spontaneously.    She was advised that hydronephrosis may be caused by an obstruction or reflux and will usually require treatment after birth.  We will put her file on the [facility] list so that the pediatricians will be aware of the hydronephrosis noted on her prenatal ultrasound exams.  She was advised that the pediatricians will often place the baby on prophylactic antibiotics after birth.  We  will also make a referral to pediatric urology/nephrology for a prenatal consultation to discuss management of hydronephrosis after birth.  The views of the fetal anatomy were limited today due to her advanced gestational age.  The right lateral/fundal placenta appeared globular in appearance.  The patient was advised that placental dysfunction may be the cause of fetal growth restriction.    Due to IUGR, we will continue to see her for weekly ultrasound exams for umbilical artery Doppler studies and biophysical profiles.  The patient reports that she will also be going to your office each week for fetal testing.  Due to severe IUGR, twice-weekly fetal testing is reasonable. Should her fetal testing and umbilical artery Doppler studies remain normal, delivery is recommended at around 37 weeks.    Another umbilical artery Doppler study and biophysical profile was scheduled in our office in 1 week.  A total of 30 minutes was spent counseling and coordinating the care for this patient.  Greater than 50% of the time was spent in direct face-to-face contact.  A roughly

## 2020-10-26 ENCOUNTER — Other Ambulatory Visit: Payer: Self-pay | Admitting: Obstetrics

## 2020-10-26 DIAGNOSIS — O359XX0 Maternal care for (suspected) fetal abnormality and damage, unspecified, not applicable or unspecified: Secondary | ICD-10-CM

## 2020-10-26 DIAGNOSIS — O365930 Maternal care for other known or suspected poor fetal growth, third trimester, not applicable or unspecified: Secondary | ICD-10-CM

## 2020-10-26 DIAGNOSIS — O99213 Obesity complicating pregnancy, third trimester: Secondary | ICD-10-CM

## 2020-10-26 DIAGNOSIS — O43123 Velamentous insertion of umbilical cord, third trimester: Secondary | ICD-10-CM

## 2020-11-04 ENCOUNTER — Other Ambulatory Visit: Payer: Self-pay

## 2020-11-04 ENCOUNTER — Encounter: Payer: Self-pay | Admitting: *Deleted

## 2020-11-04 ENCOUNTER — Ambulatory Visit: Payer: BC Managed Care – PPO | Attending: Obstetrics

## 2020-11-04 ENCOUNTER — Ambulatory Visit: Payer: BC Managed Care – PPO | Admitting: *Deleted

## 2020-11-04 VITALS — BP 155/84 | HR 78

## 2020-11-04 DIAGNOSIS — O43123 Velamentous insertion of umbilical cord, third trimester: Secondary | ICD-10-CM | POA: Diagnosis not present

## 2020-11-04 DIAGNOSIS — Z362 Encounter for other antenatal screening follow-up: Secondary | ICD-10-CM

## 2020-11-04 DIAGNOSIS — O365930 Maternal care for other known or suspected poor fetal growth, third trimester, not applicable or unspecified: Secondary | ICD-10-CM | POA: Diagnosis not present

## 2020-11-04 DIAGNOSIS — E669 Obesity, unspecified: Secondary | ICD-10-CM

## 2020-11-04 DIAGNOSIS — O359XX0 Maternal care for (suspected) fetal abnormality and damage, unspecified, not applicable or unspecified: Secondary | ICD-10-CM | POA: Diagnosis not present

## 2020-11-04 DIAGNOSIS — O99213 Obesity complicating pregnancy, third trimester: Secondary | ICD-10-CM | POA: Insufficient documentation

## 2020-11-04 DIAGNOSIS — Z8616 Personal history of COVID-19: Secondary | ICD-10-CM

## 2020-11-04 DIAGNOSIS — Z3A35 35 weeks gestation of pregnancy: Secondary | ICD-10-CM

## 2020-11-04 LAB — OB RESULTS CONSOLE GBS: GBS: NEGATIVE

## 2020-11-08 ENCOUNTER — Other Ambulatory Visit: Payer: Self-pay

## 2020-11-09 ENCOUNTER — Telehealth (HOSPITAL_COMMUNITY): Payer: Self-pay | Admitting: *Deleted

## 2020-11-09 ENCOUNTER — Other Ambulatory Visit: Payer: Self-pay | Admitting: Obstetrics & Gynecology

## 2020-11-09 NOTE — Telephone Encounter (Signed)
Preadmission screen  

## 2020-11-10 ENCOUNTER — Encounter (HOSPITAL_COMMUNITY): Payer: Self-pay | Admitting: *Deleted

## 2020-11-10 ENCOUNTER — Telehealth (HOSPITAL_COMMUNITY): Payer: Self-pay | Admitting: *Deleted

## 2020-11-10 ENCOUNTER — Inpatient Hospital Stay (HOSPITAL_COMMUNITY)
Admission: AD | Admit: 2020-11-10 | Discharge: 2020-11-10 | Disposition: A | Discharge status: Home / Self Care | Payer: BC Managed Care – PPO | Attending: Obstetrics and Gynecology | Admitting: Obstetrics and Gynecology

## 2020-11-10 ENCOUNTER — Encounter (HOSPITAL_COMMUNITY): Payer: Self-pay | Admitting: Obstetrics and Gynecology

## 2020-11-10 ENCOUNTER — Other Ambulatory Visit: Payer: Self-pay

## 2020-11-10 DIAGNOSIS — O1403 Mild to moderate pre-eclampsia, third trimester: Secondary | ICD-10-CM | POA: Insufficient documentation

## 2020-11-10 DIAGNOSIS — Z3A36 36 weeks gestation of pregnancy: Secondary | ICD-10-CM | POA: Diagnosis not present

## 2020-11-10 DIAGNOSIS — Z79899 Other long term (current) drug therapy: Secondary | ICD-10-CM | POA: Insufficient documentation

## 2020-11-10 DIAGNOSIS — Z3689 Encounter for other specified antenatal screening: Secondary | ICD-10-CM | POA: Diagnosis not present

## 2020-11-10 LAB — COMPREHENSIVE METABOLIC PANEL
ALT: 11 U/L (ref 0–44)
AST: 23 U/L (ref 15–41)
Albumin: 2.9 g/dL — ABNORMAL LOW (ref 3.5–5.0)
Alkaline Phosphatase: 103 U/L (ref 38–126)
Anion gap: 10 (ref 5–15)
BUN: <5 mg/dL — ABNORMAL LOW (ref 6–20)
CO2: 18 mmol/L — ABNORMAL LOW (ref 22–32)
Calcium: 9.2 mg/dL (ref 8.9–10.3)
Chloride: 107 mmol/L (ref 98–111)
Creatinine, Ser: 0.6 mg/dL (ref 0.44–1.00)
GFR, Estimated: >60 mL/min (ref >60)
Glucose, Bld: 87 mg/dL (ref 70–99)
Potassium: 3.9 mmol/L (ref 3.5–5.1)
Sodium: 135 mmol/L (ref 135–145)
Total Bilirubin: 0.4 mg/dL (ref 0.3–1.2)
Total Protein: 6.6 g/dL (ref 6.5–8.1)

## 2020-11-10 LAB — CBC
HCT: 36.1 % (ref 36.0–46.0)
Hemoglobin: 12 g/dL (ref 12.0–15.0)
MCH: 28.9 pg (ref 26.0–34.0)
MCHC: 33.2 g/dL (ref 30.0–36.0)
MCV: 87 fL (ref 80.0–100.0)
Platelets: 210 10*3/uL (ref 150–400)
RBC: 4.15 MIL/uL (ref 3.87–5.11)
RDW: 13 % (ref 11.5–15.5)
WBC: 12 10*3/uL — ABNORMAL HIGH (ref 4.0–10.5)
nRBC: 0 % (ref 0.0–0.2)

## 2020-11-10 LAB — PROTEIN / CREATININE RATIO, URINE
Creatinine, Urine: 31.89 mg/dL
Protein Creatinine Ratio: 0.22 mg/mg{Cre} — ABNORMAL HIGH (ref 0.00–0.15)
Total Protein, Urine: 7 mg/dL

## 2020-11-10 MED ORDER — HYDRALAZINE HCL 20 MG/ML IJ SOLN: 10.0000 mg | INTRAMUSCULAR | Status: DC | PRN

## 2020-11-10 MED ORDER — LABETALOL HCL 5 MG/ML IV SOLN: 80.0000 mg | INTRAVENOUS | Status: DC | PRN

## 2020-11-10 MED ORDER — LABETALOL HCL 5 MG/ML IV SOLN: 40.0000 mg | INTRAVENOUS | Status: DC | PRN

## 2020-11-10 MED ORDER — LABETALOL HCL 5 MG/ML IV SOLN
20.0000 mg | INTRAVENOUS | Status: DC | PRN
  Administered 2020-11-10: 20 mg via INTRAVENOUS
  Filled 2020-11-10: qty 4

## 2020-11-10 NOTE — Discharge Instructions (Signed)
Preeclampsia and Eclampsia Preeclampsia is a serious condition that may develop during pregnancy. This condition involves high blood pressure during pregnancy and causes symptoms such as headaches, vision changes, and increased swelling in the legs, hands, and face. Preeclampsia occurs after 20 weeks of pregnancy. Eclampsia is a seizure that happens from worsening preeclampsia. Diagnosing and managing preeclampsia early is important. If not treated early, it can cause serious problems for mother and baby. There is no cure for this condition. However, during pregnancy, delivering the baby may be the best treatment for preeclampsia or eclampsia. For most women, symptoms of preeclampsia and eclampsia go away after giving birth. In rare cases, a woman may develop preeclampsia or eclampsia after giving birth. This usually occurs within 48 hours after childbirth but may occur up to 6 weeks after giving birth. What are the causes? The cause of this condition is not known. What increases the risk? The following factors make you more likely to develop preeclampsia:  Being pregnant for the first time or being pregnant with multiples.  Having had preeclampsia or a condition called hemolysis, elevated liver enzymes, and low platelet count (HELLP)syndrome during a past pregnancy.  Having a family history of preeclampsia.  Being older than age 35.  Being obese.  Becoming pregnant through fertility treatments. Conditions that reduce blood flow or oxygen to your placenta and baby may also increase your risk. These include:  High blood pressure before, during, or immediately following pregnancy.  Kidney disease.  Diabetes.  Blood clotting disorders.  Autoimmune diseases, such as lupus.  Sleep apnea. What are the signs or symptoms? Common symptoms of this condition include:  A severe, throbbing headache that does not go away.  Vision problems, such as blurred or double vision and light  sensitivity.  Pain in the stomach, especially the right upper region.  Pain in the shoulder. Other symptoms that may develop as the condition gets worse include:  Sudden weight gain because of fluid buildup in the body. This causes swelling of the face, hands, legs, and feet.  Severe nausea and vomiting.  Urinating less than usual.  Shortness of breath.  Seizures. How is this diagnosed? Your health care provider will ask you about symptoms and check for signs of preeclampsia during your prenatal visits. You will also have routine tests, including:  Checking your blood pressure.  Urine tests to check for protein.  Blood tests to assess your organ function.  Monitoring your baby's heart rate.  Ultrasounds to check fetal growth.   How is this treated? You and your health care provider will determine the treatment that is best for you. Treatment may include:  Frequent prenatal visits to check for preeclampsia.  Medicine to lower your blood pressure.  Medicine to prevent seizures.  Low-dose aspirin during your pregnancy.  Staying in the hospital, in severe cases. You will be given medicines to control your blood pressure and the amount of fluids in your body.  Delivering your baby. Work with your health care provider to manage any chronic health conditions, such as diabetes or kidney problems. Also, work with your health care provider to manage weight gain during pregnancy. Follow these instructions at home: Eating and drinking  Drink enough fluid to keep your urine pale yellow.  Avoid caffeine. Caffeine may increase blood pressure and heart rate and lead to dehydration.  Reduce the amount of salt that you eat. Lifestyle  Do not use any products that contain nicotine or tobacco. These products include cigarettes, chewing tobacco, and   vaping devices, such as e-cigarettes. If you need help quitting, ask your health care provider.  Do not use alcohol or drugs.  Avoid  stress as much as possible.  Rest and get plenty of sleep. General instructions  Take over-the-counter and prescription medicines only as told by your health care provider.  When lying down, lie on your left side. This keeps pressure off your major blood vessels.  When sitting or lying down, raise (elevate) your feet. Try putting pillows underneath your lower legs.  Exercise regularly. Ask your health care provider what kinds of exercise are best for you.  Check your blood pressure as often as recommended by your health care provider.  Keep all prenatal and follow-up visits. This is important.   Contact a health care provider if:  You have symptoms that may need treatment or closer monitoring. These include: ? Headaches. ? Stomach pain or nausea and vomiting. ? Shoulder pain. ? Vision problems, such as spots in front of your eyes or blurry vision. ? Sudden weight gain or increased swelling in your face, hands, legs, and feet. ? Increased anxiety or feeling of impending doom. ? Signs or symptoms of labor. Get help right away if:  You have any of the following symptoms: ? A seizure. ? Shortness of breath or trouble breathing. ? Trouble speaking or slurred speech. ? Fainting. ? Chest pain. These symptoms may represent a serious problem that is an emergency. Do not wait to see if the symptoms will go away. Get medical help right away. Call your local emergency services (911 in the [place] Do not drive yourself to the hospital. Summary  Preeclampsia is a serious condition that may develop during pregnancy.  Diagnosing and treating preeclampsia early is very important.  Keep all prenatal and follow-up visits. This is important.  Get help right away if you have a seizure, shortness of breath or trouble breathing, trouble speaking or slurred speech, chest pain, or fainting. This information is not intended to replace advice given to you by your health care provider. Make sure you  discuss any questions you have with your health care provider. Document Revised: 02/18/2020 Document Reviewed: 02/18/2020 Elsevier Patient Education  2021 [employer]vier Inc.  

## 2020-11-10 NOTE — MAU Note (Signed)
Pt presents to MAU c/o elevated blood pressure of 154/101 taken on home BP cuff.  Pt states they have been "watching her blood pressure" with this pregnancy, but had no prior history of htn before the pregnancy.  Pt denies any blurred vision or excessive swelling, c/o of mild headache today. Pt states she took 2 extra strength tylenol around 1700 this evening for the headache without relief.

## 2020-11-10 NOTE — Telephone Encounter (Signed)
Preadmission screen  

## 2020-11-10 NOTE — MAU Provider Note (Addendum)
History     CSN: [id number]  Arrival date and time: 11/10/20 1903   Event Date/Time   First Provider Initiated Contact with Patient 11/10/20 2012      Chief Complaint  Patient presents with  . Hypertension   HPI Janet Norman is a 30 y.o. G2P0010 at [redacted]w[redacted]d who presents for hypertension. Was recently diagnosed with preeclampsia in the office. Reports BPs today of 150s/80s. Had a headache earlier today that was relieved with tylenol. Denies visual disturbance or epigastric pain. Denies abdominal pain, LOF, or vaginal bleeding. Reports good fetal movement.  Goes to [facility] ob/gyn. 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   OB History    Gravida  2   Para      Term      Preterm      AB  1   Living        SAB  1   IAB      Ectopic      Multiple      Live Births              Past Medical History:  Diagnosis Date  . Anxiety   . Kidney stones     Past Surgical History:  Procedure Laterality Date  . TONSILLECTOMY      Family History  Problem Relation Age of Onset  . Diabetes Mother   . Cancer Maternal Grandmother     Social History   Tobacco Use  . Smoking status: Never Smoker  . Smokeless tobacco: Never Used  Vaping Use  . Vaping Use: Never used  Substance Use Topics  . Alcohol use: No  . Drug use: No    Allergies:  Allergies  Allergen Reactions  . Red Dye   . Prednisone Hives and Rash  . Pseudophed-Chlophedianol-Gg Swelling and Rash    Medications Prior to Admission  Medication Sig Dispense Refill Last Dose  . Prenatal Vit-Fe Fumarate-FA (PRENATAL VITAMINS PO) Take by mouth.   11/10/2020 at Unknown time    Review of Systems  Constitutional: Negative.   Eyes: Negative for visual disturbance.  Gastrointestinal: Negative.   Genitourinary: Negative.   Neurological: Positive for headaches (not currently ).   Physical Exam   Blood pressure 120/86, pulse  88, temperature 99 F (37.2 C), temperature source Oral, resp. rate 18, last menstrual period 02/24/2020, SpO2 99 %.  Patient Vitals for the past 24 hrs:  BP Temp Temp src Pulse Resp SpO2  11/10/20 2101 120/86 -- -- 88 -- 99 %  11/10/20 2046 (!) 148/92 -- -- 90 -- 99 %  11/10/20 2031 (!) 138/92 -- -- 92 -- --  11/10/20 2030 -- -- -- -- -- 100 %  11/10/20 2024 (!) 144/89 -- -- 91 -- --  11/10/20 2000 -- -- -- -- -- 100 %  11/10/20 1958 (!) 180/106 -- -- (!) 106 -- 100 %  11/10/20 1949 (!) 170/100 99 F (37.2 C) Oral (!) 103 18 99 %    Physical Exam Vitals and nursing note reviewed.  Constitutional:      General: She is not in acute distress.    Appearance: Normal appearance.  HENT:     Head: Normocephalic and atraumatic.  Eyes:     General: No scleral icterus. Cardiovascular:     Rate and Rhythm: Normal rate and regular rhythm.     Heart sounds: Normal heart sounds.  Pulmonary:     Effort: Pulmonary effort  is normal. No respiratory distress.     Breath sounds: Normal breath sounds.  Abdominal:     Palpations: Abdomen is soft.     Tenderness: There is no abdominal tenderness.     Comments: gravid  Musculoskeletal:     Right lower leg: 2+ Pitting Edema present.     Left lower leg: 2+ Pitting Edema present.  Skin:    General: Skin is warm and dry.  Neurological:     Mental Status: She is alert.     Deep Tendon Reflexes:     Reflex Scores:      Patellar reflexes are 2+ on the right side and 2+ on the left side.    Comments: No clonus  Psychiatric:        Mood and Affect: Mood normal.        Behavior: Behavior normal.    NST:  Baseline: 135 bpm, Variability: Good {> 6 bpm), Accelerations: Reactive and Decelerations: Absent  MAU Course  Procedures Results for orders placed or performed during the hospital encounter of 11/10/20 (from the past 24 hour(s))  Protein / creatinine ratio, urine     Status: Abnormal   Collection Time: 11/10/20  7:49 PM  Result Value Ref  Range   Creatinine, Urine 31.89 mg/dL   Total Protein, Urine 7 mg/dL   Protein Creatinine Ratio 0.22 (H) 0.00 - 0.15 mg/mg[Cre]  CBC     Status: Abnormal   Collection Time: 11/10/20  8:26 PM  Result Value Ref Range   WBC 12.0 (H) 4.0 - 10.5 K/uL   RBC 4.15 3.87 - 5.11 MIL/uL   Hemoglobin 12.0 12.0 - 15.0 g/dL   HCT 89.2 11.9 - 41.7 %   MCV 87.0 80.0 - 100.0 fL   MCH 28.9 26.0 - 34.0 pg   MCHC 33.2 30.0 - 36.0 g/dL   RDW 40.8 14.4 - 81.8 %   Platelets 210 150 - 400 K/uL   nRBC 0.0 0.0 - 0.2 %  Comprehensive metabolic panel     Status: Abnormal   Collection Time: 11/10/20  8:26 PM  Result Value Ref Range   Sodium 135 135 - 145 mmol/L   Potassium 3.9 3.5 - 5.1 mmol/L   Chloride 107 98 - 111 mmol/L   CO2 18 (L) 22 - 32 mmol/L   Glucose, Bld 87 70 - 99 mg/dL   BUN <5 (L) 6 - 20 mg/dL   Creatinine, Ser 5.63 0.44 - 1.00 mg/dL   Calcium 9.2 8.9 - 14.9 mg/dL   Total Protein 6.6 6.5 - 8.1 g/dL   Albumin 2.9 (L) 3.5 - 5.0 g/dL   AST 23 15 - 41 U/L   ALT 11 0 - 44 U/L   Alkaline Phosphatase 103 38 - 126 U/L   Total Bilirubin 0.4 0.3 - 1.2 mg/dL   GFR, Estimated >70 >26 mL/min   Anion gap 10 5 - 15    MDM Patient recently diagnosed with preeclampsia who presents with severe range BPs. IV antihypertensive protocol ordered & preeclampsia labs collected.   Severe range BP x 2 - additional BPs continue to be not severe after 1 dose of IV labetalol. Lab results pending. Patient otherwise asymptomatic.  Care turned over to [facility]  Judeth Horn, NP 11/10/2020 9:13 PM  Assessment and Plan  Reassessment (9:43 PM) Vitals:   11/10/20 2130 11/10/20 2135 11/10/20 2140 11/10/20 2145  BP: 137/88   130/87  Pulse: 79   75  Resp:  Temp:      TempSrc:      SpO2: 100% 99% 100% 99%    -Labs return normal with slight elevation in PC Ratio, but not criteria. -NST Remains reactive.  -Dr. Marcie Bal consulted and informed of patient status, evaluation, interventions, and results.  Advised: *Okay to discharge home. *Patient to follow up as scheduled. -Nurse instructed to inform patient to continue to monitor blood pressures. -Return to hospital with onset of any PreE symptoms or decreased fetal movement. -Discharge to home in stable condition.  Cherre Robins MSN, CNM Advanced Practice Provider, [facility]

## 2020-11-11 ENCOUNTER — Ambulatory Visit: Payer: BC Managed Care – PPO | Admitting: *Deleted

## 2020-11-11 ENCOUNTER — Ambulatory Visit: Payer: BC Managed Care – PPO | Attending: Obstetrics

## 2020-11-11 ENCOUNTER — Encounter: Payer: Self-pay | Admitting: *Deleted

## 2020-11-11 VITALS — BP 132/83 | HR 71

## 2020-11-11 DIAGNOSIS — O365930 Maternal care for other known or suspected poor fetal growth, third trimester, not applicable or unspecified: Secondary | ICD-10-CM

## 2020-11-11 DIAGNOSIS — O43123 Velamentous insertion of umbilical cord, third trimester: Secondary | ICD-10-CM | POA: Insufficient documentation

## 2020-11-11 DIAGNOSIS — O359XX0 Maternal care for (suspected) fetal abnormality and damage, unspecified, not applicable or unspecified: Secondary | ICD-10-CM | POA: Insufficient documentation

## 2020-11-11 DIAGNOSIS — O99213 Obesity complicating pregnancy, third trimester: Secondary | ICD-10-CM | POA: Diagnosis not present

## 2020-11-14 ENCOUNTER — Other Ambulatory Visit (HOSPITAL_COMMUNITY)
Admission: RE | Admit: 2020-11-14 | Discharge: 2020-11-14 | Disposition: A | Discharge status: Home / Self Care | Payer: BC Managed Care – PPO | Source: Ambulatory Visit | Attending: Obstetrics & Gynecology | Admitting: Obstetrics & Gynecology

## 2020-11-14 DIAGNOSIS — Z01812 Encounter for preprocedural laboratory examination: Secondary | ICD-10-CM | POA: Insufficient documentation

## 2020-11-14 DIAGNOSIS — Z20822 Contact with and (suspected) exposure to covid-19: Secondary | ICD-10-CM | POA: Insufficient documentation

## 2020-11-14 LAB — SARS CORONAVIRUS 2 (TAT 6-24 HRS): SARS Coronavirus 2: NEGATIVE

## 2020-11-15 ENCOUNTER — Inpatient Hospital Stay (HOSPITAL_COMMUNITY): Payer: BC Managed Care – PPO | Admitting: Anesthesiology

## 2020-11-15 ENCOUNTER — Inpatient Hospital Stay (HOSPITAL_COMMUNITY)
Admission: AD | Admit: 2020-11-15 | Discharge: 2020-11-19 | DRG: 788 | Disposition: A | Discharge status: Home / Self Care | Payer: BC Managed Care – PPO | Attending: Obstetrics and Gynecology | Admitting: Obstetrics and Gynecology

## 2020-11-15 ENCOUNTER — Encounter (HOSPITAL_COMMUNITY): Payer: Self-pay | Admitting: Obstetrics & Gynecology

## 2020-11-15 ENCOUNTER — Inpatient Hospital Stay (HOSPITAL_COMMUNITY): Payer: BC Managed Care – PPO

## 2020-11-15 ENCOUNTER — Encounter (HOSPITAL_COMMUNITY)
Admission: AD | Disposition: A | Discharge status: Home / Self Care | Payer: Self-pay | Source: Home / Self Care | Attending: Obstetrics and Gynecology

## 2020-11-15 ENCOUNTER — Other Ambulatory Visit: Payer: Self-pay

## 2020-11-15 DIAGNOSIS — O99893 Other specified diseases and conditions complicating puerperium: Secondary | ICD-10-CM | POA: Diagnosis not present

## 2020-11-15 DIAGNOSIS — Z20822 Contact with and (suspected) exposure to covid-19: Secondary | ICD-10-CM | POA: Diagnosis present

## 2020-11-15 DIAGNOSIS — O43893 Other placental disorders, third trimester: Secondary | ICD-10-CM | POA: Diagnosis present

## 2020-11-15 DIAGNOSIS — O99214 Obesity complicating childbirth: Secondary | ICD-10-CM | POA: Diagnosis present

## 2020-11-15 DIAGNOSIS — O43123 Velamentous insertion of umbilical cord, third trimester: Secondary | ICD-10-CM | POA: Diagnosis present

## 2020-11-15 DIAGNOSIS — I951 Orthostatic hypotension: Secondary | ICD-10-CM | POA: Diagnosis not present

## 2020-11-15 DIAGNOSIS — F419 Anxiety disorder, unspecified: Secondary | ICD-10-CM | POA: Diagnosis present

## 2020-11-15 DIAGNOSIS — O1414 Severe pre-eclampsia complicating childbirth: Secondary | ICD-10-CM | POA: Diagnosis present

## 2020-11-15 DIAGNOSIS — Z98891 History of uterine scar from previous surgery: Secondary | ICD-10-CM

## 2020-11-15 DIAGNOSIS — O99344 Other mental disorders complicating childbirth: Secondary | ICD-10-CM | POA: Diagnosis present

## 2020-11-15 DIAGNOSIS — Z3A36 36 weeks gestation of pregnancy: Secondary | ICD-10-CM

## 2020-11-15 DIAGNOSIS — Z3490 Encounter for supervision of normal pregnancy, unspecified, unspecified trimester: Secondary | ICD-10-CM | POA: Diagnosis present

## 2020-11-15 DIAGNOSIS — O1410 Severe pre-eclampsia, unspecified trimester: Secondary | ICD-10-CM | POA: Diagnosis present

## 2020-11-15 DIAGNOSIS — O358XX0 Maternal care for other (suspected) fetal abnormality and damage, not applicable or unspecified: Secondary | ICD-10-CM | POA: Diagnosis present

## 2020-11-15 DIAGNOSIS — O365930 Maternal care for other known or suspected poor fetal growth, third trimester, not applicable or unspecified: Secondary | ICD-10-CM | POA: Diagnosis present

## 2020-11-15 LAB — CBC
HCT: 33.7 % — ABNORMAL LOW (ref 36.0–46.0)
HCT: 33.5 % — ABNORMAL LOW (ref 36.0–46.0)
HCT: 31.3 % — ABNORMAL LOW (ref 36.0–46.0)
Hemoglobin: 11.7 g/dL — ABNORMAL LOW (ref 12.0–15.0)
Hemoglobin: 11.1 g/dL — ABNORMAL LOW (ref 12.0–15.0)
Hemoglobin: 10.6 g/dL — ABNORMAL LOW (ref 12.0–15.0)
MCH: 29.7 pg (ref 26.0–34.0)
MCH: 28.8 pg (ref 26.0–34.0)
MCH: 29.5 pg (ref 26.0–34.0)
MCHC: 34.7 g/dL (ref 30.0–36.0)
MCHC: 33.1 g/dL (ref 30.0–36.0)
MCHC: 33.9 g/dL (ref 30.0–36.0)
MCV: 85.5 fL (ref 80.0–100.0)
MCV: 87 fL (ref 80.0–100.0)
MCV: 87.2 fL (ref 80.0–100.0)
Platelets: 213 10*3/uL (ref 150–400)
Platelets: 220 10*3/uL (ref 150–400)
Platelets: 221 10*3/uL (ref 150–400)
RBC: 3.94 MIL/uL (ref 3.87–5.11)
RBC: 3.85 MIL/uL — ABNORMAL LOW (ref 3.87–5.11)
RBC: 3.59 MIL/uL — ABNORMAL LOW (ref 3.87–5.11)
RDW: 12.7 % (ref 11.5–15.5)
RDW: 13 % (ref 11.5–15.5)
RDW: 12.9 % (ref 11.5–15.5)
WBC: 15.9 10*3/uL — ABNORMAL HIGH (ref 4.0–10.5)
WBC: 14.1 10*3/uL — ABNORMAL HIGH (ref 4.0–10.5)
WBC: 23.2 10*3/uL — ABNORMAL HIGH (ref 4.0–10.5)
nRBC: 0 % (ref 0.0–0.2)
nRBC: 0 % (ref 0.0–0.2)
nRBC: 0 % (ref 0.0–0.2)

## 2020-11-15 LAB — TYPE AND SCREEN
ABO/RH(D): A POS
Antibody Screen: NEGATIVE

## 2020-11-15 LAB — COMPREHENSIVE METABOLIC PANEL
ALT: 13 U/L (ref 0–44)
AST: 23 U/L (ref 15–41)
Albumin: 2.9 g/dL — ABNORMAL LOW (ref 3.5–5.0)
Alkaline Phosphatase: 106 U/L (ref 38–126)
Anion gap: 10 (ref 5–15)
BUN: 6 mg/dL (ref 6–20)
CO2: 19 mmol/L — ABNORMAL LOW (ref 22–32)
Calcium: 9.1 mg/dL (ref 8.9–10.3)
Chloride: 104 mmol/L (ref 98–111)
Creatinine, Ser: 0.65 mg/dL (ref 0.44–1.00)
GFR, Estimated: >60 mL/min (ref >60)
Glucose, Bld: 109 mg/dL — ABNORMAL HIGH (ref 70–99)
Potassium: 3.6 mmol/L (ref 3.5–5.1)
Sodium: 133 mmol/L — ABNORMAL LOW (ref 135–145)
Total Bilirubin: 0.3 mg/dL (ref 0.3–1.2)
Total Protein: 6.6 g/dL (ref 6.5–8.1)

## 2020-11-15 LAB — PROTEIN / CREATININE RATIO, URINE
Creatinine, Urine: 36.15 mg/dL
Protein Creatinine Ratio: 0.22 mg/mg{Cre} — ABNORMAL HIGH (ref 0.00–0.15)
Total Protein, Urine: 8 mg/dL

## 2020-11-15 LAB — RPR: RPR Ser Ql: NONREACTIVE

## 2020-11-15 LAB — URIC ACID: Uric Acid, Serum: 5.3 mg/dL (ref 2.5–7.1)

## 2020-11-15 SURGERY — Surgical Case
Anesthesia: Epidural | Wound class: Contaminated

## 2020-11-15 MED ORDER — SIMETHICONE 80 MG PO CHEW
80.0000 mg | CHEWABLE_TABLET | Freq: Three times a day (TID) | ORAL | Status: DC
  Administered 2020-11-16 – 2020-11-19 (×11): 80 mg via ORAL
  Filled 2020-11-15 (×11): qty 1

## 2020-11-15 MED ORDER — MORPHINE SULFATE (PF) 0.5 MG/ML IJ SOLN
INTRAMUSCULAR | Status: AC
  Filled 2020-11-15: qty 10

## 2020-11-15 MED ORDER — KETOROLAC TROMETHAMINE 30 MG/ML IJ SOLN
INTRAMUSCULAR | Status: AC
  Filled 2020-11-15: qty 1

## 2020-11-15 MED ORDER — LIDOCAINE HCL (PF) 1 % IJ SOLN
INTRAMUSCULAR | Status: DC | PRN
  Administered 2020-11-15 (×2): 4 mL via EPIDURAL

## 2020-11-15 MED ORDER — KETOROLAC TROMETHAMINE 30 MG/ML IJ SOLN: 30.0000 mg | Freq: Once | INTRAMUSCULAR | Status: DC

## 2020-11-15 MED ORDER — SODIUM CHLORIDE 0.9 % IV SOLN: INTRAVENOUS | Status: DC | PRN

## 2020-11-15 MED ORDER — LIDOCAINE HCL (PF) 1 % IJ SOLN: 30.0000 mL | INTRAMUSCULAR | Status: DC | PRN

## 2020-11-15 MED ORDER — COCONUT OIL OIL: 1.0000 "application " | TOPICAL_OIL | Status: DC | PRN

## 2020-11-15 MED ORDER — IBUPROFEN 600 MG PO TABS
600.0000 mg | ORAL_TABLET | Freq: Four times a day (QID) | ORAL | Status: DC
  Administered 2020-11-16 – 2020-11-19 (×12): 600 mg via ORAL
  Filled 2020-11-15 (×12): qty 1

## 2020-11-15 MED ORDER — SENNOSIDES-DOCUSATE SODIUM 8.6-50 MG PO TABS
2.0000 | ORAL_TABLET | Freq: Every day | ORAL | Status: DC
  Administered 2020-11-16 – 2020-11-19 (×3): 2 via ORAL
  Filled 2020-11-15 (×4): qty 2

## 2020-11-15 MED ORDER — CEFAZOLIN SODIUM-DEXTROSE 2-3 GM-%(50ML) IV SOLR
INTRAVENOUS | Status: DC | PRN
  Administered 2020-11-15: 2 g via INTRAVENOUS

## 2020-11-15 MED ORDER — FENTANYL CITRATE (PF) 100 MCG/2ML IJ SOLN
INTRAMUSCULAR | Status: AC
  Filled 2020-11-15: qty 2

## 2020-11-15 MED ORDER — ZOLPIDEM TARTRATE 5 MG PO TABS: 5.0000 mg | ORAL_TABLET | Freq: Every evening | ORAL | Status: DC | PRN

## 2020-11-15 MED ORDER — EPHEDRINE 5 MG/ML INJ: 10.0000 mg | INTRAVENOUS | Status: DC | PRN

## 2020-11-15 MED ORDER — OXYTOCIN-SODIUM CHLORIDE 30-0.9 UT/500ML-% IV SOLN
2.5000 [IU]/h | INTRAVENOUS | Status: DC
  Administered 2020-11-15: 30 [IU] via INTRAVENOUS

## 2020-11-15 MED ORDER — KETOROLAC TROMETHAMINE 30 MG/ML IJ SOLN
30.0000 mg | Freq: Four times a day (QID) | INTRAMUSCULAR | Status: AC
  Administered 2020-11-15 – 2020-11-16 (×4): 30 mg via INTRAVENOUS
  Filled 2020-11-15 (×4): qty 1

## 2020-11-15 MED ORDER — KETOROLAC TROMETHAMINE 30 MG/ML IJ SOLN: 30.0000 mg | Freq: Four times a day (QID) | INTRAMUSCULAR | Status: DC | PRN

## 2020-11-15 MED ORDER — OXYTOCIN-SODIUM CHLORIDE 30-0.9 UT/500ML-% IV SOLN
INTRAVENOUS | Status: AC
  Filled 2020-11-15: qty 500

## 2020-11-15 MED ORDER — ACETAMINOPHEN 160 MG/5ML PO SOLN: 1000.0000 mg | Freq: Once | ORAL | Status: DC

## 2020-11-15 MED ORDER — WITCH HAZEL-GLYCERIN EX PADS: 1.0000 "application " | MEDICATED_PAD | CUTANEOUS | Status: DC | PRN

## 2020-11-15 MED ORDER — SIMETHICONE 80 MG PO CHEW
80.0000 mg | CHEWABLE_TABLET | ORAL | Status: DC | PRN
  Administered 2020-11-18: 80 mg via ORAL
  Filled 2020-11-15: qty 1

## 2020-11-15 MED ORDER — CEFAZOLIN SODIUM-DEXTROSE 2-4 GM/100ML-% IV SOLN
INTRAVENOUS | Status: AC
  Filled 2020-11-15: qty 100

## 2020-11-15 MED ORDER — ONDANSETRON HCL 4 MG/2ML IJ SOLN: 4.0000 mg | Freq: Three times a day (TID) | INTRAMUSCULAR | Status: DC | PRN

## 2020-11-15 MED ORDER — BUPIVACAINE HCL (PF) 0.5 % IJ SOLN
INTRAMUSCULAR | Status: DC | PRN
  Administered 2020-11-15: 10 mL

## 2020-11-15 MED ORDER — ACETAMINOPHEN 500 MG PO TABS
1000.0000 mg | ORAL_TABLET | Freq: Four times a day (QID) | ORAL | Status: AC
  Filled 2020-11-15 (×2): qty 2

## 2020-11-15 MED ORDER — LIDOCAINE-EPINEPHRINE (PF) 2 %-1:200000 IJ SOLN
INTRAMUSCULAR | Status: DC | PRN
  Administered 2020-11-15 (×2): 5 mL via EPIDURAL
  Administered 2020-11-15: 4 mL via EPIDURAL
  Administered 2020-11-15 (×2): 3 mL via EPIDURAL

## 2020-11-15 MED ORDER — OXYTOCIN BOLUS FROM INFUSION: 333.0000 mL | Freq: Once | INTRAVENOUS | Status: DC

## 2020-11-15 MED ORDER — MORPHINE SULFATE (PF) 0.5 MG/ML IJ SOLN
INTRAMUSCULAR | Status: DC | PRN
  Administered 2020-11-15: 3 mg via EPIDURAL

## 2020-11-15 MED ORDER — TERBUTALINE SULFATE 1 MG/ML IJ SOLN: 0.2500 mg | Freq: Once | INTRAMUSCULAR | Status: DC | PRN

## 2020-11-15 MED ORDER — ACETAMINOPHEN 500 MG PO TABS
1000.0000 mg | ORAL_TABLET | Freq: Four times a day (QID) | ORAL | Status: DC
  Administered 2020-11-16 – 2020-11-19 (×14): 1000 mg via ORAL
  Filled 2020-11-15 (×14): qty 2

## 2020-11-15 MED ORDER — NALBUPHINE HCL 10 MG/ML IJ SOLN: 5.0000 mg | INTRAMUSCULAR | Status: DC | PRN

## 2020-11-15 MED ORDER — LABETALOL HCL 5 MG/ML IV SOLN: 80.0000 mg | INTRAVENOUS | Status: DC | PRN

## 2020-11-15 MED ORDER — BUPIVACAINE HCL (PF) 0.5 % IJ SOLN
INTRAMUSCULAR | Status: AC
  Filled 2020-11-15: qty 30

## 2020-11-15 MED ORDER — NALBUPHINE HCL 10 MG/ML IJ SOLN: 5.0000 mg | Freq: Once | INTRAMUSCULAR | Status: DC | PRN

## 2020-11-15 MED ORDER — DIPHENHYDRAMINE HCL 50 MG/ML IJ SOLN: 12.5000 mg | INTRAMUSCULAR | Status: DC | PRN

## 2020-11-15 MED ORDER — OXYCODONE HCL 5 MG PO TABS
5.0000 mg | ORAL_TABLET | ORAL | Status: DC | PRN
  Administered 2020-11-17 – 2020-11-18 (×3): 5 mg via ORAL
  Filled 2020-11-15 (×3): qty 1

## 2020-11-15 MED ORDER — SOD CITRATE-CITRIC ACID 500-334 MG/5ML PO SOLN
30.0000 mL | ORAL | Status: DC | PRN
  Administered 2020-11-15: 30 mL via ORAL
  Filled 2020-11-15: qty 15

## 2020-11-15 MED ORDER — OXYTOCIN-SODIUM CHLORIDE 30-0.9 UT/500ML-% IV SOLN: 2.5000 [IU]/h | INTRAVENOUS | Status: AC

## 2020-11-15 MED ORDER — MISOPROSTOL 25 MCG QUARTER TABLET
25.0000 ug | ORAL_TABLET | ORAL | Status: DC | PRN
  Administered 2020-11-15: 25 ug via VAGINAL
  Filled 2020-11-15: qty 1

## 2020-11-15 MED ORDER — MAGNESIUM SULFATE 40 GM/1000ML IV SOLN
INTRAVENOUS | Status: AC
  Filled 2020-11-15: qty 1000

## 2020-11-15 MED ORDER — FENTANYL-BUPIVACAINE-NACL 0.5-0.125-0.9 MG/250ML-% EP SOLN
12.0000 mL/h | EPIDURAL | Status: DC | PRN
  Administered 2020-11-15: 12 mL/h via EPIDURAL
  Filled 2020-11-15: qty 250

## 2020-11-15 MED ORDER — FENTANYL CITRATE (PF) 100 MCG/2ML IJ SOLN
100.0000 ug | INTRAMUSCULAR | Status: DC | PRN
  Administered 2020-11-15 (×2): 100 ug via INTRAVENOUS
  Filled 2020-11-15 (×2): qty 2

## 2020-11-15 MED ORDER — HYDRALAZINE HCL 20 MG/ML IJ SOLN: 10.0000 mg | INTRAMUSCULAR | Status: DC | PRN

## 2020-11-15 MED ORDER — NALOXONE HCL 4 MG/10ML IJ SOLN
1.0000 ug/kg/h | INTRAVENOUS | Status: DC | PRN
  Filled 2020-11-15: qty 5

## 2020-11-15 MED ORDER — OXYTOCIN-SODIUM CHLORIDE 30-0.9 UT/500ML-% IV SOLN
1.0000 m[IU]/min | INTRAVENOUS | Status: DC
  Administered 2020-11-15: 2 m[IU]/min via INTRAVENOUS
  Filled 2020-11-15: qty 500

## 2020-11-15 MED ORDER — PHENYLEPHRINE 40 MCG/ML (10ML) SYRINGE FOR IV PUSH (FOR BLOOD PRESSURE SUPPORT): 80.0000 ug | PREFILLED_SYRINGE | INTRAVENOUS | Status: DC | PRN

## 2020-11-15 MED ORDER — LACTATED RINGERS IV SOLN: INTRAVENOUS | Status: DC

## 2020-11-15 MED ORDER — TETANUS-DIPHTH-ACELL PERTUSSIS 5-2.5-18.5 LF-MCG/0.5 IM SUSY: 0.5000 mL | PREFILLED_SYRINGE | Freq: Once | INTRAMUSCULAR | Status: DC

## 2020-11-15 MED ORDER — ONDANSETRON HCL 4 MG/2ML IJ SOLN
INTRAMUSCULAR | Status: AC
  Filled 2020-11-15: qty 2

## 2020-11-15 MED ORDER — DIPHENHYDRAMINE HCL 25 MG PO CAPS: 25.0000 mg | ORAL_CAPSULE | Freq: Four times a day (QID) | ORAL | Status: DC | PRN

## 2020-11-15 MED ORDER — PRENATAL MULTIVITAMIN CH
1.0000 | ORAL_TABLET | Freq: Every day | ORAL | Status: DC
  Administered 2020-11-16 – 2020-11-19 (×4): 1 via ORAL
  Filled 2020-11-15 (×4): qty 1

## 2020-11-15 MED ORDER — LABETALOL HCL 5 MG/ML IV SOLN: 40.0000 mg | INTRAVENOUS | Status: DC | PRN

## 2020-11-15 MED ORDER — LABETALOL HCL 5 MG/ML IV SOLN
20.0000 mg | INTRAVENOUS | Status: DC | PRN
  Filled 2020-11-15: qty 4

## 2020-11-15 MED ORDER — LACTATED RINGERS IV SOLN: 500.0000 mL | INTRAVENOUS | Status: DC | PRN

## 2020-11-15 MED ORDER — DIPHENHYDRAMINE HCL 50 MG/ML IJ SOLN: 12.5000 mg | Freq: Four times a day (QID) | INTRAMUSCULAR | Status: DC | PRN

## 2020-11-15 MED ORDER — MAGNESIUM SULFATE 40 GM/1000ML IV SOLN
2.0000 g/h | INTRAVENOUS | Status: AC
  Administered 2020-11-16: 2 g/h via INTRAVENOUS
  Filled 2020-11-15: qty 1000

## 2020-11-15 MED ORDER — MENTHOL 3 MG MT LOZG: 1.0000 | LOZENGE | OROMUCOSAL | Status: DC | PRN

## 2020-11-15 MED ORDER — LACTATED RINGERS IV SOLN: INTRAVENOUS | Status: DC | PRN

## 2020-11-15 MED ORDER — FENTANYL CITRATE (PF) 100 MCG/2ML IJ SOLN
INTRAMUSCULAR | Status: DC | PRN
  Administered 2020-11-15: 100 ug via EPIDURAL

## 2020-11-15 MED ORDER — FENTANYL CITRATE (PF) 100 MCG/2ML IJ SOLN: 25.0000 ug | INTRAMUSCULAR | Status: DC | PRN

## 2020-11-15 MED ORDER — LACTATED RINGERS IV SOLN: 500.0000 mL | Freq: Once | INTRAVENOUS | Status: DC

## 2020-11-15 MED ORDER — ACETAMINOPHEN 500 MG PO TABS: 1000.0000 mg | ORAL_TABLET | Freq: Once | ORAL | Status: DC

## 2020-11-15 MED ORDER — NALOXONE HCL 0.4 MG/ML IJ SOLN: 0.4000 mg | INTRAMUSCULAR | Status: DC | PRN

## 2020-11-15 MED ORDER — DEXAMETHASONE SODIUM PHOSPHATE 4 MG/ML IJ SOLN
INTRAMUSCULAR | Status: DC | PRN
  Administered 2020-11-15: 4 mg via INTRAVENOUS

## 2020-11-15 MED ORDER — ACETAMINOPHEN 325 MG PO TABS
650.0000 mg | ORAL_TABLET | ORAL | Status: DC | PRN
  Administered 2020-11-15: 650 mg via ORAL
  Filled 2020-11-15: qty 2

## 2020-11-15 MED ORDER — DIBUCAINE (PERIANAL) 1 % EX OINT: 1.0000 "application " | TOPICAL_OINTMENT | CUTANEOUS | Status: DC | PRN

## 2020-11-15 MED ORDER — SODIUM CHLORIDE 0.9% FLUSH: 3.0000 mL | INTRAVENOUS | Status: DC | PRN

## 2020-11-15 MED ORDER — MAGNESIUM SULFATE BOLUS VIA INFUSION
4.0000 g | Freq: Once | INTRAVENOUS | Status: AC
  Administered 2020-11-15: 4 g via INTRAVENOUS
  Filled 2020-11-15: qty 1000

## 2020-11-15 MED ORDER — ONDANSETRON HCL 4 MG/2ML IJ SOLN: 4.0000 mg | Freq: Four times a day (QID) | INTRAMUSCULAR | Status: DC | PRN

## 2020-11-15 MED ORDER — PROMETHAZINE HCL 25 MG/ML IJ SOLN: 6.2500 mg | INTRAMUSCULAR | Status: DC | PRN

## 2020-11-15 SURGICAL SUPPLY — 39 items
BENZOIN TINCTURE PRP APPL 2/3 (GAUZE/BANDAGES/DRESSINGS) ×3 IMPLANT
CHLORAPREP W/TINT 26ML (MISCELLANEOUS) ×3 IMPLANT
CLAMP CORD UMBIL (MISCELLANEOUS) IMPLANT
CLOSURE STERI STRIP 1/2 X4 (GAUZE/BANDAGES/DRESSINGS) ×3 IMPLANT
CLOSURE WOUND 1/2 X4 (GAUZE/BANDAGES/DRESSINGS)
CLOTH BEACON ORANGE TIMEOUT ST (SAFETY) ×3 IMPLANT
DRSG OPSITE POSTOP 4X10 (GAUZE/BANDAGES/DRESSINGS) ×3 IMPLANT
ELECT REM PT RETURN 9FT ADLT (ELECTROSURGICAL) ×3
ELECTRODE REM PT RTRN 9FT ADLT (ELECTROSURGICAL) ×1 IMPLANT
EXTRACTOR VACUUM KIWI (MISCELLANEOUS) ×3 IMPLANT
GLOVE BIOGEL PI IND STRL 6.5 (GLOVE) ×1 IMPLANT
GLOVE BIOGEL PI IND STRL 7.0 (GLOVE) ×2 IMPLANT
GLOVE BIOGEL PI INDICATOR 6.5 (GLOVE) ×2
GLOVE BIOGEL PI INDICATOR 7.0 (GLOVE) ×4
GLOVE ECLIPSE 6.0 STRL STRAW (GLOVE) ×3 IMPLANT
GLOVE SURG ENC TEXT LTX SZ6.5 (GLOVE) ×3 IMPLANT
GOWN STRL REUS W/TWL LRG LVL3 (GOWN DISPOSABLE) ×6 IMPLANT
KIT ABG SYR 3ML LUER SLIP (SYRINGE) IMPLANT
NEEDLE HYPO 25X5/8 SAFETYGLIDE (NEEDLE) IMPLANT
NS IRRIG 1000ML POUR BTL (IV SOLUTION) ×3 IMPLANT
PACK C SECTION WH (CUSTOM PROCEDURE TRAY) ×3 IMPLANT
PAD OB MATERNITY 4.3X12.25 (PERSONAL CARE ITEMS) ×3 IMPLANT
PENCIL SMOKE EVAC W/HOLSTER (ELECTROSURGICAL) ×3 IMPLANT
RETRACTOR WND ALEXIS 25 LRG (MISCELLANEOUS) IMPLANT
RTRCTR WOUND ALEXIS 25CM LRG (MISCELLANEOUS)
STRIP CLOSURE SKIN 1/2X4 (GAUZE/BANDAGES/DRESSINGS) IMPLANT
SUT MNCRL 0 VIOLET CTX 36 (SUTURE) ×2 IMPLANT
SUT MONOCRYL 0 CTX 36 (SUTURE) ×4
SUT PLAIN 2 0 XLH (SUTURE) IMPLANT
SUT VIC AB 0 CT1 36 (SUTURE) ×6 IMPLANT
SUT VIC AB 2-0 CT1 27 (SUTURE) ×4
SUT VIC AB 2-0 CT1 TAPERPNT 27 (SUTURE) ×2 IMPLANT
SUT VIC AB 3-0 CT1 27 (SUTURE) ×2
SUT VIC AB 3-0 CT1 TAPERPNT 27 (SUTURE) ×1 IMPLANT
SUT VIC AB 4-0 KS 27 (SUTURE) ×3 IMPLANT
SUT VIC AB 4-0 PS2 27 (SUTURE) ×3 IMPLANT
TOWEL OR 17X24 6PK STRL BLUE (TOWEL DISPOSABLE) ×3 IMPLANT
TRAY FOLEY W/BAG SLVR 14FR LF (SET/KITS/TRAYS/PACK) IMPLANT
WATER STERILE IRR 1000ML POUR (IV SOLUTION) ×3 IMPLANT

## 2020-11-15 NOTE — Anesthesia Procedure Notes (Signed)
Epidural Patient location during procedure: OB Start time: 11/15/2020 4:05 PM End time: 11/15/2020 4:14 PM  Staffing Anesthesiologist: Mal Amabile, MD Performed: anesthesiologist   Preanesthetic Checklist Completed: patient identified, IV checked, site marked, risks and benefits discussed, surgical consent, monitors and equipment checked, pre-op evaluation and timeout performed  Epidural Patient position: sitting Prep: DuraPrep and site prepped and draped Patient monitoring: continuous pulse ox and blood pressure Approach: midline Location: L3-L4 Injection technique: LOR air  Needle:  Needle type: Tuohy  Needle gauge: 17 G Needle length: 9 cm and 9 Needle insertion depth: 6 cm Catheter type: closed end flexible Catheter size: 19 Gauge Catheter at skin depth: 11 cm Test dose: negative and Other  Assessment Events: blood not aspirated, injection not painful, no injection resistance, no paresthesia and negative IV test  Additional Notes Patient identified. Risks and benefits discussed including failed block, incomplete  Pain control, post dural puncture headache, nerve damage, paralysis, blood pressure Changes, nausea, vomiting, reactions to medications-both toxic and allergic and post Partum back pain. All questions were answered. Patient expressed understanding and wished to proceed. Sterile technique was used throughout procedure. Epidural site was Dressed with sterile barrier dressing. No paresthesias, signs of intravascular injection Or signs of intrathecal spread were encountered.  Patient was more comfortable after the epidural was dosed. Please see RN's note for documentation of vital signs and FHR which are stable. Reason for block:procedure for pain

## 2020-11-15 NOTE — Progress Notes (Addendum)
Comfortable with epidural  Patient Vitals for the past 24 hrs:  BP Temp Temp src Pulse Resp SpO2 Height Weight  11/15/20 1636 (!) 144/84 -- -- 89 18 100 % -- --  11/15/20 1631 140/79 -- -- 93 18 100 % -- --  11/15/20 1626 (!) 155/93 -- -- 96 18 100 % -- --  11/15/20 1621 (!) 149/92 -- -- 91 18 100 % -- --  11/15/20 1617 (!) 165/109 -- -- (!) 102 20 -- -- --  11/15/20 1615 -- -- -- -- -- 100 % -- --  11/15/20 1612 (!) 143/88 -- -- (!) 107 18 -- -- --  11/15/20 1611 -- -- -- -- -- 100 % -- --  11/15/20 1602 (!) 163/76 -- -- (!) 110 18 -- -- --  11/15/20 1531 (!) 149/95 -- -- 100 18 -- -- --  11/15/20 1504 132/72 99.4 F (37.4 C) Oral 87 18 -- -- --  11/15/20 1439 (!) 148/94 -- -- (!) 101 20 -- -- --  11/15/20 1331 (!) 147/93 99.7 F (37.6 C) Axillary 95 18 -- -- --  11/15/20 1300 (!) 145/88 -- -- 92 18 -- -- --  11/15/20 1231 (!) 142/89 -- -- 94 18 -- -- --  11/15/20 1133 -- 98.8 F (37.1 C) Oral -- -- -- -- --  11/15/20 1131 (!) 137/94 -- -- 90 18 -- -- --  11/15/20 1101 125/90 -- -- (!) 101 18 -- -- --  11/15/20 1031 (!) 147/99 -- -- 98 18 -- -- --  11/15/20 1001 (!) 146/95 -- -- 97 18 -- -- --  11/15/20 0956 (!) 152/95 -- -- 97 18 -- -- --  11/15/20 0901 (!) 154/104 -- -- (!) 109 18 -- -- --  11/15/20 0838 -- 99.2 F (37.3 C) Oral -- -- -- -- --  11/15/20 0801 (!) 147/92 -- -- 96 18 -- -- --  11/15/20 0701 (!) 147/100 -- -- (!) 105 18 -- -- --  11/15/20 0601 (!) 145/95 99.2 F (37.3 C) Oral 95 -- -- -- --  11/15/20 0535 (!) 146/99 -- -- (!) 109 -- -- -- --  11/15/20 0201 (!) 145/100 -- -- 98 18 -- -- --  11/15/20 0105 135/90 99.4 F (37.4 C) Oral (!) 107 18 -- -- --  11/15/20 0017 (!) 149/97 -- -- (!) 146 19 -- -- --  11/15/20 0015 -- -- -- -- -- -- 5\' 2"  (1.575 m) 99.6 kg   A&ox3 nml respirations Abd: soft, nt, gravid  FHT: 120s, nml variability, +accels, repetitive late decelerations TOCO: irregular 1-2 minutes, occasional couplet  A/P:  1. Recurrent late decels -  stop pitocin now; if persist despite no pitocin will need to proceed for c/s; will give rest, and if late decelerations resolve, will resume pitocin;  Concern for fetal intolerance for labor and that fetus will not tolerate contractions when pitocin resumes; this was discussed with patient and her husband, questions answered; understands NICU team in attendance for delivery, reviewed typical post op care; agrees with plan   FHT: 130s, nml variability, late decels TOCO: irregular, spacing out  A/P: Fetal intolerance to labor - late decels continue with ctx and no pitocin, remote from delivery; recommend proceed to OR, pt and husband agree; reviewed risk of procedure including bleeding (possible need for transfusion), infection, risk of injury to bowel, bladder, nerves, blood vessels; risk of further surgery, risk of anesthesia, risk of blood clot to legs/lungs; proceed to OR

## 2020-11-15 NOTE — H&P (Addendum)
Janet Norman is a 30 y.o. female presenting for IOL at 36.6 wks for Preeclampsia and IUGR G2P0, SAB Hx, took Prometrium in 1st trimester  Single Umbilical artery, normal fetal echo. Velamentous cord, IUGR in 3rd trimester at 32 weeks and MFM referral due to thick bulbous placenta. At 33 wks noted EFW 4'1" at 4% and AC at 7%, FL<1%, UAD nl. Since then weekly UAD, BPP, AFI with MFM- wnl Right fetal pyelectasis noted worse at 35 wks at 17 mm but down to 8 mm and 10 mm at 35 wk and 36 sono  Mild Preeclampsia diagnosed in office with BP elevated in 140s/90s and urine P/C at 0.3 at 35 weeks but at 6/3 MAU visit urine p/c ratio was 0.22 however she received IV Labetalol per protocol for severe range BPs and was advised to keep 6/7 IOL plan today  OB History    Gravida  2   Para      Term      Preterm      AB  1   Living        SAB  1   IAB      Ectopic      Multiple      Live Births             Past Medical History:  Diagnosis Date  . Anxiety   . Kidney stones    Past Surgical History:  Procedure Laterality Date  . TONSILLECTOMY     Family History: family history includes Cancer in her maternal grandmother; Diabetes in her mother. Social History:  reports that she has never smoked. She has never used smokeless tobacco. She reports that she does not drink alcohol and does not use drugs.     Maternal Diabetes: No Genetic Screening: Normal  QUAD. Carrier screen -Horizon4 neg  Maternal Ultrasounds/Referrals: IUGR, Fetal renal pyelectasis and Other:  Single umbilical artery- fetal echo nl  Fetal Ultrasounds or other Referrals:  Fetal echo, Referred to Materal Fetal Medicine  Maternal Substance Abuse:  No Significant Maternal Medications:  Meds include: Progesterone  In 1st trim Significant Maternal Lab Results:  Group B Strep negative Other Comments:  thick bulbous placenta   Review of Systems neg  History Exam Physical Exam  Ht 5\' 2"  (1.575 m)   Wt  99.6 kg   LMP 02/24/2020   BMI 40.17 kg/m  Physical exam:  A&O x 3, no acute distress. Pleasant HEENT neg, no thyromegaly Lungs CTA bilat CV RRR, S1S2 normal Abdo soft, non tender, non acute Extr no edema/ tenderness Pelvic per RN at admission FHT  150s mod variability, + accels no decels cat I Toco q 5 min  Prenatal labs: ABO, Rh:  A+ Antibody:  Neg Rubella:  Imm VZ Immune RPR:   Neg x 2 HBsAg:   neg HIV:   Neg GBS:   Neg Glucola 134 AFP4 low risk Horizon 4 carrier screen negative  Assessment/Plan: 30 yo G2P0 at 36.6 wks here for IOL for preeclampsia and IUGR. FHT cat I. GBS(-), EFW  Cytotec PO 50 mcg and then pitocin as needed PIH labs, monitor BP closely, no symptoms, plan magnesium PP  Velamentous 2 vessel cord - monitor in labor Thick bulbous placenta -- will send to path Right fetal pyelectasis (improved at 35, 36 wk sono) Peds to follow postnatally  26 11/15/2020, 12:48 AM

## 2020-11-15 NOTE — Progress Notes (Signed)
Uncomfortable with epidural, waiting for anesthesiologist No other c/o  Patient Vitals for the past 24 hrs:  BP Temp Temp src Pulse Resp Height Weight  11/15/20 1504 132/72 99.4 F (37.4 C) Oral 87 18 -- --  11/15/20 1439 (!) 148/94 -- -- (!) 101 20 -- --  11/15/20 1331 (!) 147/93 99.7 F (37.6 C) Axillary 95 18 -- --  11/15/20 1300 (!) 145/88 -- -- 92 18 -- --  11/15/20 1231 (!) 142/89 -- -- 94 18 -- --  11/15/20 1133 -- 98.8 F (37.1 C) Oral -- -- -- --  11/15/20 1131 (!) 137/94 -- -- 90 18 -- --  11/15/20 1101 125/90 -- -- (!) 101 18 -- --  11/15/20 1031 (!) 147/99 -- -- 98 18 -- --  11/15/20 1001 (!) 146/95 -- -- 97 18 -- --  11/15/20 0956 (!) 152/95 -- -- 97 18 -- --  11/15/20 0901 (!) 154/104 -- -- (!) 109 18 -- --  11/15/20 0838 -- 99.2 F (37.3 C) Oral -- -- -- --  11/15/20 0801 (!) 147/92 -- -- 96 18 -- --  11/15/20 0701 (!) 147/100 -- -- (!) 105 18 -- --  11/15/20 0601 (!) 145/95 99.2 F (37.3 C) Oral 95 -- -- --  11/15/20 0535 (!) 146/99 -- -- (!) 109 -- -- --  11/15/20 0201 (!) 145/100 -- -- 98 18 -- --  11/15/20 0105 135/90 99.4 F (37.4 C) Oral (!) 107 18 -- --  11/15/20 0017 (!) 149/97 -- -- (!) 146 19 -- --  11/15/20 0015 -- -- -- -- -- 5\' 2"  (1.575 m) 99.6 kg   A&ox3 Abd: soft, nt, gravid Cx: 4.5/80/-2, cephalic; iupc placed LE: tr edema, nt bilat  FHT: 120s, nml variability, +accels, +occasional early decel, variables - as low as to 90s briefly TOCO: irregular q 1-2 min  A/P: iup at 36.6 wga with pre-e, iugr 1. IOL - s/p cytotec, balloon catheter, no just on pitocin; have not been able to increase pitocin d/t fetal tracing; pt now with iupc, getting epidural now; will slowly increase pitocin if reassuring fetal tracing 2. Fetal status: overall reassuring but will monitor closely; if return of deep variables, plan amnioinfusion; pt understands risk of fetus not tolerating labor and risk of c/s with fetal intolerance of labor; contin plan for svd 3. Mild  pre-eclampsia: mild range bps, asymptomatic; follow bps closely for worsening s/s 4. IUGR, nml doppler, right fetal pyelectasis - peds aware 5. Velamentous CI, bulbous placenta - send to path

## 2020-11-15 NOTE — Anesthesia Preprocedure Evaluation (Addendum)
Anesthesia Evaluation  Patient identified by MRN, date of birth, ID band Patient awake    Reviewed: Allergy & Precautions, NPO status , Patient's Chart, lab work & pertinent test results  History of Anesthesia Complications Negative for: history of anesthetic complications  Airway Mallampati: II  TM Distance: >3 FB Neck ROM: Full    Dental  (+) Teeth Intact   Pulmonary neg pulmonary ROS,    Pulmonary exam normal breath sounds clear to auscultation       Cardiovascular negative cardio ROS Normal cardiovascular exam Rhythm:Regular Rate:Normal     Neuro/Psych Anxiety negative neurological ROS     GI/Hepatic Neg liver ROS, GERD  ,  Endo/Other  Morbid obesity  Renal/GU Renal diseaseHx/o renal calculi  negative genitourinary   Musculoskeletal negative musculoskeletal ROS (+)   Abdominal (+) + obese,   Peds  Hematology  (+) anemia ,   Anesthesia Other Findings   Reproductive/Obstetrics (+) Pregnancy Gestational HTN                            Anesthesia Physical Anesthesia Plan  ASA: III and emergent  Anesthesia Plan: Epidural   Post-op Pain Management:    Induction:   PONV Risk Score and Plan: 3 and Treatment may vary due to age or medical condition, Dexamethasone and Ondansetron  Airway Management Planned: Natural Airway  Additional Equipment:   Intra-op Plan:   Post-operative Plan:   Informed Consent: I have reviewed the patients History and Physical, chart, labs and discussed the procedure including the risks, benefits and alternatives for the proposed anesthesia with the patient or authorized representative who has indicated his/her understanding and acceptance.       Plan Discussed with: Anesthesiologist  Anesthesia Plan Comments: (Epidural to be used for urgent C/S (NRFHT). Stephannie Peters, MD)       Anesthesia Quick Evaluation

## 2020-11-15 NOTE — Progress Notes (Addendum)
Janet Norman is a 30 y.o. G2P0010 at [redacted]w[redacted]d by ultrasound admitted for induction of labor due to IUGR and Preeclampsia with severe range BPs 4 days back.  Subjective: S/p Cytotec x 1 overnight. SROM at 9 am, clear fluid, Cervical balloon at 9.45 am and expelled at 12.40 pm per RN, UCs are getting more painful  Objective: BP (!) 147/93   Pulse 95   Temp 99.7 F (37.6 C) (Axillary)   Resp 18   Ht 5\' 2"  (1.575 m)   Wt 99.6 kg   LMP 02/24/2020   BMI 40.17 kg/m  No intake/output data recorded. Total I/O In: 354.9 [P.O.:240; I.V.:114.9] Out: -  Patient Vitals for the past 24 hrs:  BP Temp Temp src Pulse Resp Height Weight  11/15/20 1331 (!) 147/93 99.7 F (37.6 C) Axillary 95 18 -- --  11/15/20 1300 (!) 145/88 -- -- 92 18 -- --  11/15/20 1231 (!) 142/89 -- -- 94 18 -- --  11/15/20 1133 -- 98.8 F (37.1 C) Oral -- -- -- --  11/15/20 1131 (!) 137/94 -- -- 90 18 -- --  11/15/20 1101 125/90 -- -- (!) 101 18 -- --  11/15/20 1031 (!) 147/99 -- -- 98 18 -- --  11/15/20 1001 (!) 146/95 -- -- 97 18 -- --  11/15/20 0956 (!) 152/95 -- -- 97 18 -- --  11/15/20 0901 (!) 154/104 -- -- (!) 109 18 -- --  11/15/20 0838 -- 99.2 F (37.3 C) Oral -- -- -- --  11/15/20 0801 (!) 147/92 -- -- 96 18 -- --  11/15/20 0701 (!) 147/100 -- -- (!) 105 18 -- --  11/15/20 0601 (!) 145/95 99.2 F (37.3 C) Oral 95 -- -- --  11/15/20 0535 (!) 146/99 -- -- (!) 109 -- -- --  11/15/20 0201 (!) 145/100 -- -- 98 18 -- --  11/15/20 0105 135/90 99.4 F (37.4 C) Oral (!) 107 18 -- --  11/15/20 0017 (!) 149/97 -- -- (!) 146 19 -- --  11/15/20 0015 -- -- -- -- -- 5\' 2"  (1.575 m) 99.6 kg   FHR: 140 bpm, variability: moderate,  accelerations:  Present,  decelerations:  Absent. Occasional variable decels and one late decel noted since  UC:   regular, every 2-3 minutes, pito was at 4 and RN advised to cut back to 2 mu SVE:   Dilation: 4 Effacement (%): 50 Station: -3 Exam by:: ,  RN  Labs: Lab Results  Component Value Date   WBC 14.1 (H) 11/15/2020   HGB 11.1 (L) 11/15/2020   HCT 33.5 (L) 11/15/2020   MCV 87.0 11/15/2020   PLT 220 11/15/2020    Assessment / Plan: Induction of labor due to preeclampsia and IUGR,  progressing well on pitocin  Labor: Progressing normally, early labor, asynclitic presentation,work with position changes Preeclampsia:  no signs or symptoms of toxicity, labs stable and holding magnesium but reassess postpartum if magnesium needed and also antiHTN med Fetal Wellbeing:  Category I Pain Control:  Epidural per pt request  I/D:  GBS(-) Anticipated MOD:  NSVD  01/15/2021 11/15/2020, 1:59 PM

## 2020-11-15 NOTE — Op Note (Signed)
Cesarean Section Procedure Note   Janet Norman  11/15/2020  Indications: fetal intolerance to labor, remote from delivery   Pre-operative Diagnosis: primary cesarean, fetal intolerance to labor; iugr, 2 vessels cord, velamentous CI, fetal pyelectasis, bulbous placenta   Procedure: 1ltcs Post-operative Diagnosis: Same ; nuchal x1: loose and easily reduced  Surgeon: Surgeon(s) and Role:    * Bryna Razavi, Candace Gallus, MD - Primary   Assistants: Dorisann Frames  Anesthesia: epidural   Procedure Details:  The patient was seen in the Holding Room. The risks, benefits, complications, treatment options, and expected outcomes were discussed with the patient. The patient concurred with the proposed plan, giving informed consent. identified as [name] and the procedure verified as C-Section Delivery. A Time Out was held and the above information confirmed.  After induction of anesthesia, the patient was draped and prepped in the usual sterile manner, foley was draining urine well.  The patient was noting pinching during allis test at incision, 0.25% marcaine was then used at incision.  A pfannenstiel incision was made and carried down through the subcutaneous tissue to the fascia. Fascial incision was made and extended transversely. The fascia was separated from the underlying rectus tissue superiorly and inferiorly. The peritoneum was identified and entered. Peritoneal incision was extended longitudinally. Alexis-O retractor placed. The utero-vesical peritoneal reflection was incised transversely and the bladder flap was bluntly freed from the lower uterine segment. A low transverse uterine incision was made. Delivered from asynclitic, cephalic presentation was a viable female infant with vigorous cry. Apgar scores of 8 at one minute and 9 at five minutes. Delayed cord clamping done at 1 minute and baby handed to NICU team in attendance. Cord ph was sent. Cord blood was obtained for  evaluation. The placenta was removed Intact and appeared small with areas that were smooth and bulbous, intact. The uterine outline, tubes and ovaries appeared normal}. The uterine incision was closed with running locked sutures of 0 monocryl. A second imbricating layer was sutured with the same sutre.   Hemostasis was observed. Alexis retractor removed. Peritoneal closure done with 2-0 Vicryl.  The fascia was then reapproximated with running sutures of 0Vicryl. The subcuticular closure was performed using 2- vicryl. The skin was closed with 4-0Vicryl.   Instrument, sponge, and needle counts were correct prior the abdominal closure and were correct at the conclusion of the case. Good hemostasis was noted prior to closure of each layer.   Findings: viable female infant, apgars 8/9, minimal scratch/laceration on face about 1-14mm   Estimated Blood Loss: 470 mL   Total IV Fluids:   Urine Output: 150CC OF clear urine  Specimens: placenta to pathology   Complications: no complications  Disposition: PACU - hemodynamically stable.   Maternal Condition: stable; severe range bps in OR, plan 24 hr magnesium sulfate  Baby condition / location:  Couplet care / Skin to Skin  Attending Attestation: I performed the procedure.   Signed: Surgeon(s): Amado Nash Candace Gallus, MD

## 2020-11-15 NOTE — Anesthesia Postprocedure Evaluation (Signed)
Anesthesia Post Note  Patient: Janet Norman  Procedure(s) Performed: CESAREAN SECTION (N/A )     Patient location during evaluation: PACU Anesthesia Type: Epidural Level of consciousness: awake and alert and oriented Pain management: pain level controlled Vital Signs Assessment: post-procedure vital signs reviewed and stable Respiratory status: spontaneous breathing, nonlabored ventilation and respiratory function stable Cardiovascular status: blood pressure returned to baseline Postop Assessment: epidural receding, no apparent nausea or vomiting, no headache and no backache Anesthetic complications: no   No complications documented.  Last Vitals:  Vitals:   11/15/20 2030 11/15/20 2039  BP: (!) 154/88   Pulse: (!) 101 (!) 104  Resp: 16 20  Temp:    SpO2: 98% 99%    Last Pain:  Vitals:   11/15/20 2000  TempSrc:   PainSc: 0-No pain   Pain Goal: Patients Stated Pain Goal: 3 (11/15/20 1626)  LLE Motor Response: Purposeful movement (11/15/20 2039) LLE Sensation: Tingling (11/15/20 2039) RLE Motor Response: Purposeful movement (11/15/20 2039) RLE Sensation: Tingling (11/15/20 2039)     Epidural/Spinal Function Cutaneous sensation: Tingles (11/15/20 2039)  Kaylyn Layer

## 2020-11-15 NOTE — Transfer of Care (Signed)
Immediate Anesthesia Transfer of Care Note  Patient: Janet Norman  Procedure(s) Performed: CESAREAN SECTION (N/A )  Patient Location: PACU  Anesthesia Type:Epidural  Level of Consciousness: awake, alert  and oriented  Airway & Oxygen Therapy: Patient Spontanous Breathing  Post-op Assessment: Report given to RN and Post -op Vital signs reviewed and stable  Post vital signs: Reviewed and stable  Last Vitals:  Vitals Value Taken Time  BP 147/103 11/15/20 1939  Temp    Pulse 99 11/15/20 1945  Resp 18 11/15/20 1945  SpO2 100 % 11/15/20 1945  Vitals shown include unvalidated device data.  Last Pain:  Vitals:   11/15/20 1626  TempSrc:   PainSc: 2       Patients Stated Pain Goal: 3 (11/15/20 1626)  Complications: No complications documented.

## 2020-11-15 NOTE — Progress Notes (Signed)
S: Feeling refreshed after a shower. Discussed the R/B/A of IP Foley balloon placement for cervical ripening and patient consents to procedure. Husband, Janet Norman, at the bedside providing support. Eagerly anticipating baby boy, "Janet Norman".   O: Vitals:   11/15/20 0701 11/15/20 0801 11/15/20 0838 11/15/20 0901  BP: (!) 147/100 (!) 147/92  (!) 154/104  Pulse: (!) 105 96  (!) 109  Resp: 18 18  18   Temp:   99.2 F (37.3 C)   TempSrc:   Oral   Weight:      Height:       FHT:  FHR: 140 bpm, variability: moderate,  accelerations:  Present,  decelerations:  Absent UC:   irregular, every 2-5 minutes SVE:   Dilation: 1 Effacement (%): 50 Station: -3 Exam by:: [email or web address], CNM   Foley balloon placed without difficulty. 60cc of fluid instilled in balloon and taped to leg for traction. Patient tolerated procedure well.  A / P: Induction of labor due to preeclampsia, s/p Cytotec and SROM of clear fluid  Fetal Wellbeing:  Category I Pain Control:  Labor support without medications  PEC: No signs/symptoms of toxicity, labs stables, BPs mild range Anticipated MOD:  NSVD   Will start Pitocin 2x2 until adequate contraction pattern.   Dr. Dorisann Frames updated on patient status and plan of care.   Juliene Pina, CNM, MSN 11/15/2020, 10:05 AM

## 2020-11-16 ENCOUNTER — Encounter (HOSPITAL_COMMUNITY): Payer: Self-pay | Admitting: Obstetrics and Gynecology

## 2020-11-16 LAB — CBC
HCT: 32.2 % — ABNORMAL LOW (ref 36.0–46.0)
Hemoglobin: 10.8 g/dL — ABNORMAL LOW (ref 12.0–15.0)
MCH: 29 pg (ref 26.0–34.0)
MCHC: 33.5 g/dL (ref 30.0–36.0)
MCV: 86.6 fL (ref 80.0–100.0)
Platelets: 223 10*3/uL (ref 150–400)
RBC: 3.72 MIL/uL — ABNORMAL LOW (ref 3.87–5.11)
RDW: 12.8 % (ref 11.5–15.5)
WBC: 21.8 10*3/uL — ABNORMAL HIGH (ref 4.0–10.5)
nRBC: 0 % (ref 0.0–0.2)

## 2020-11-16 LAB — MAGNESIUM: Magnesium: 5.4 mg/dL — ABNORMAL HIGH (ref 1.7–2.4)

## 2020-11-16 MED ORDER — LACTATED RINGERS IV SOLN: INTRAVENOUS | Status: DC

## 2020-11-16 NOTE — Progress Notes (Addendum)
                                                                                                                                                                                                                                                                                                                        Subjective: POD# 1 Live born female  Birth Weight: 4 lb 15.7 oz (2260 g) APGAR: 8, 9  Newborn Delivery   Birth date/time: 11/15/2020 18:39:00 Delivery type: C-Section, Low Transverse C-section categorization: Primary     Baby name: Janet Norman Delivering provider: ALMQUIST, Janet E   circumcision planned inpatient Feeding: breast and bottle  Pain control at delivery: Epidural   Reports feeling well, but a bit tired on magnesium, had some dizziness with standing at bedside.  Patient reports tolerating PO.   Breast symptoms:working on latch Pain controlled with  PO meds Denies HA/SOB/C/P/N/V/dizziness. Flatus absent. She reports vaginal bleeding as normal, without clots.  She has not been ambulating yet, foley cath in place.   Objective:   VS:    Vitals:   11/16/20 0600 11/16/20 0801 11/16/20 0900 11/16/20 1000  BP: 112/74 122/73    Pulse: 84 85    Resp: 16 17 16 17  Temp:  97.9 F (36.6 C)    TempSrc:  Oral    SpO2:  98%    Weight:      Height:          Intake/Output Summary (Last 24 hours) at 11/16/2020 1029 Last data filed at 11/16/2020 1000 Gross per 24 hour  Intake 4794.36 ml  Output 2995 ml  Net 1799.36 ml        Recent Labs    11/15/20 2025 11/16/20 0413  WBC 23.2* 21.8*  HGB 10.6* 10.8*  HCT 31.3* 32.2*  PLT 221 223   Hepatic Function Latest Ref Rng & Units 11/15/2020 11/10/2020  Total Protein 6.5 - 8.1  g/dL 6.6 6.6  Albumin 3.5 - 5.0 g/dL 2.9(L) 2.9(L)    AST 15 - 41 U/L 23 23  ALT 0 - 44 U/L 13 11  Alk Phosphatase 38 - 126 U/L 106 103  Total Bilirubin 0.3 - 1.2 mg/dL 0.3 0.4     Blood type: --/--/A POS (06/07 0030)  Rubella: Immune (12/10 0000)  Vaccines: TDaP          UTD                    COVID-19 declined   Physical Exam:  General: alert, cooperative and no distress CV: Regular rate and rhythm Resp: clear Abdomen: soft, nontender, normal bowel sounds Incision: clean, dry and intact Uterine Fundus: firm, below umbilicus, nontender Lochia: minimal Ext: no edema, redness or tenderness in the calves or thighs  Assessment/Plan: 30 y.o.   POD# 1. [id number]                  Principal Problem:   Postpartum care following cesarean delivery 6/7 Active Problems:   Encounter for induction of labor   Failed induction of labor, NRFHTs   Status post primary low transverse cesarean section 6/7   Preeclampsia, severe  - BP stable, no neural sx, some postural hypotension on Magnesium Sulfate, will draw level now  - Mag Sulfate 2 gm x 24 hrs, completed at 2000 today  - good diuresis  - monitor closely for rebound hypertension   Anxiety  - stable off meds  Doing well, stable.               Advance diet as tolerated Encourage rest when baby rests Breastfeeding support Encourage to ambulate when off meds Routine post-op care  Janet Norman, CNM, MSN 11/16/2020, 10:29 AM   

## 2020-11-16 NOTE — Lactation Note (Signed)
This note was copied from a baby's chart. Lactation Consultation Note  Patient Name: Janet Norman [id number] Date: 11/16/2020 Reason for consult: Follow-up assessment;Mother's request;Early term 37-38.6wks;Infant < 6lbs;Other (Comment) (Hypoglycemia / SLP/ PIH) Age:28 hours   LC went in to see how breastfeeding going. SLP did an eval allowing mother to latch for about 5 minutes, then supplement with NFfant purple nipple in side line position . Infant just fed prior to visit 16 ml of Similac Neosure 22 cal/oz  Mom provided with curve tip syringe to offer EBM via finger feeding and curve tip before bottle feeding with Similac 22.   Mom pumped twice today and states comfortable fit with 24 flange. Mom to use EBM for nipple care and denies any trauma or abrasion with pumping.   LC reviewed behavior of LPTI < 6 lbs and how to reduce calorie loss keeping hand on all times, feeding 8-12x in 24 hr period no more than 3 hrs without an attempt and keeping total feeding under 30 minutes.  Mom following feeding plan established by SLP  All questions answered at the end of the visit.   Maternal Data    Feeding Mother's Current Feeding Choice: Breast Milk and Formula  LATCH Score                    Lactation Tools Discussed/Used Tools: Pump;Flanges Flange Size: 24 Breast pump type: Double-Electric Breast Pump Pump Education: Setup, frequency, and cleaning;Milk Storage Reason for Pumping: increase stimulation Pumping frequency: every 3 hrs for 15 minutes  Interventions Interventions: Breast feeding basics reviewed;DEBP;Skin to skin;Hand express;Expressed milk;Education  Discharge    Consult Status Consult Status: Follow-up Date: 11/17/20 Follow-up type: In-patient    Renalda Locklin  Nicholson-Springer 11/16/2020, 4:38 PM

## 2020-11-16 NOTE — Lactation Note (Addendum)
This note was copied from a baby's chart. Lactation Consultation Note Baby 6 hrs old at start of consult. Mom was BF when LC entered rm. LC repositioned baby. Mom has great everted nipples. LPI information sheet given to mom and reviewed. Mom stated understanding. Purple nipples given to use instead of yellow nipples. Baby done well. Mom fed baby upright pace feeding. Baby took 7 ml formula. Hand expression demonstrated. Clear colostrum noted. Mom stated she had seen it brown earlier.  Mom shown how to use DEBP & how to disassemble, clean, & reassemble parts. Mom knows to pump q3h for 15-20 min. Noted drop of brown colostrum on flange. Discussed "rusty pipe syndrome". LPI behavior discussed. Encouraged mom to call for questions or concerns.  Plan: Mom is to BF baby no longer than 20 minutes Supplement w/formula or colostrum when available Pump and give any colostrum collected for the next feeding. Hand express after pumping.   Lactation brochure given.  Patient Name: Janet Norman [id number] Date: 11/16/2020 Reason for consult: Initial assessment;Primapara;Infant < 6lbs;Late-preterm 34-36.6wks Age:15 hours  Maternal Data Has patient been taught Hand Expression?: Yes Does the patient have breastfeeding experience prior to this delivery?: No  Feeding Mother's Current Feeding Choice: Breast Milk and Formula Nipple Type: Slow - flow  LATCH Score Latch: Grasps breast easily, tongue down, lips flanged, rhythmical sucking.  Audible Swallowing: A few with stimulation  Type of Nipple: Everted at rest and after stimulation  Comfort (Breast/Nipple): Soft / non-tender  Hold (Positioning): Assistance needed to correctly position infant at breast and maintain latch.  LATCH Score: 8   Lactation Tools Discussed/Used Tools: Pump Breast pump type: Double-Electric Breast Pump Pump Education: Setup, frequency, and cleaning;Milk Storage Reason for Pumping: LPI Pumping frequency:  Q3 hr Pumped volume: 0 mL  Interventions Interventions: Breast feeding basics reviewed;Support pillows;Assisted with latch;Position options;Skin to skin;Breast massage;Hand express;DEBP;Breast compression;Adjust position  Discharge [name] Program: No  Consult Status Consult Status: Follow-up Date: 11/16/20 Follow-up type: In-patient    Charyl Dancer 11/16/2020, 1:49 AM

## 2020-11-17 LAB — SURGICAL PATHOLOGY

## 2020-11-17 NOTE — Lactation Note (Signed)
This note was copied from a baby's chart. Lactation Consultation Note  Patient Name: Janet Norman [id number] Date: 11/17/2020 Reason for consult: Follow-up assessment;Late-preterm 34-36.6wks;1st time breastfeeding;Infant < 6lbs;Primapara Age:28 hours   LC Follow Up Visit:  Attempted to visit with mother, however, she was asleep.  Will have evening LC follow up to assess current feeding plan.   Maternal Data    Feeding    LATCH Score                    Lactation Tools Discussed/Used    Interventions    Discharge    Consult Status Consult Status: Follow-up Date: 11/17/20 Follow-up type: In-patient    [name] 11/17/2020, 3:39 PM

## 2020-11-17 NOTE — Progress Notes (Addendum)
Subjective: POD# 2 Live born female  Birth Weight: 4 lb 15.7 oz (2260 g) APGAR: 8, 9  Newborn Delivery   Birth date/time: 11/15/2020 18:39:00 Delivery type: C-Section, Low Transverse C-section categorization: Primary     Baby name: Redmond Baseman Delivering provider: Rhoderick Moody E   circumcision planned inpatient Feeding: breast and bottle  Pain control at delivery: Epidural   Reports feeling much better since Mag Sulfate DCed.  Patient reports tolerating PO.   Breast symptoms:+ colostrum Pain controlled with  PO meds Denies HA/SOB/C/P/N/V/dizziness. Flatus present. She reports vaginal bleeding as normal, without clots.  She is ambulating, urinating without difficulty.     Objective:   VS:    Vitals:   11/16/20 2006 11/17/20 0453 11/17/20 0455 11/17/20 0745  BP: 120/81 120/82  128/77  Pulse: 86 88  81  Resp: 18   18  Temp: 98.1 F (36.7 C)   98.3 F (36.8 C)  TempSrc: Oral   Oral  SpO2: 98%  99% 100%  Weight:      Height:         Intake/Output Summary (Last 24 hours) at 11/17/2020 0844 Last data filed at 11/17/2020 0748 Gross per 24 hour  Intake 3170.69 ml  Output 3000 ml  Net 170.69 ml        Recent Labs    11/15/20 2025 11/16/20 0413  WBC 23.2* 21.8*  HGB 10.6* 10.8*  HCT 31.3* 32.2*  PLT 221 223     Blood type: --/--/A POS (06/07 0030)  Rubella: Immune (12/10 0000)  Vaccines: TDaP          UTD  COVID-19 UTD   Physical Exam:  General: alert, cooperative, and no distress Abdomen: soft, nontender, normal bowel sounds Incision: clean, dry, and intact Uterine Fundus: firm, below umbilicus, nontender Lochia: minimal Ext: no edema, redness or tenderness in the calves or thighs  Assessment/Plan: 30 y.o.   POD# 2. [id number]                  Principal Problem:   Postpartum care following cesarean delivery 6/7 Active Problems:   Encounter for induction of labor   Failed induction of labor, NRFHTs   Status post primary low transverse cesarean section 6/7   Preeclampsia, severe  - s/p Mag Sulfate x 24 hrs, normal Mag level yesterday, normotensive since delivery, no neural sx.   - continue to monitor closely   Anxiety  - stable off meds  Doing well, stable.    Routine post-op care Anticipate DC in AM  Neta Mends, CNM, MSN 11/17/2020, 8:44 AM     Patient also seen by me.  Reports no dizziness or headache since magnesium stopped.  No scotomata.  No right upper quadrant pain.  Positive flatus.  Voiding and ambulating well.  Pain well controlled  Vitals reviewed.  Blood pressure normalized  Physical exam: Cardiovascular regular rate and rhythm Pulmonary: Clear to auscultation bilaterally Abdomen: Soft, appropriately tender, nondistended, no right upper quadrant pain Lower extremity: Trace edema, nontender Incision: Dressed, clean  Assessment plan: Postpartum from urgent C-section for fetal distress in the setting of IUGR and preeclampsia.  Baby doing well but still following glucose and bilirubin.  Expect circumcision tomorrow.  Patient doing well from postoperative standpoint.  In terms of preeclampsia blood pressures remain stable.  Exam is benign.  Continue to watch blood pressures but patient does not need any blood pressure medicines at this time.  Expect discharge home tomorrow  Lendon Colonel 11/17/2020 10:36 AM

## 2020-11-17 NOTE — Progress Notes (Signed)
CSW received consult for hx of Anxiety.  CSW met with MOB to offer support and complete assessment.    CSW met with MOB at bedside. CSW introduced self and explained role. MOB was welcoming, pleasant and remained engaged during assessment. CSW and MOB discussed MOB's mental health history. MOB reported that she was diagnosed with anxiety in 2016. MOB denied any current symptoms. MOB reported that she last experienced symptoms of anxiety the week leading up to giving birth. MOB attributed her anxiety to her health and giving birth. MOB reported that she is not currently taking any medication nor participating in therapy to treat anxiety. CSW inquired about MOB's coping skills, MOB reported that being with her dogs helps her to cope with anxiety. MOB denied any additional mental health history. CSW inquired about how MOB was feeling emotionally after giving birth, MOB reported that she was feeling good. CSW inquired about MOB's support system, MOB reported that her husband and father are supports. MOB reported that she has all items needed to care for infant including a car seat, bedside basinet and crib. MOB presented calm and did not demonstrate any acute mental health signs/symptoms. CSW assessed for safety, MOB denied SI, HI and domestic violence.   CSW provided education regarding the baby blues period vs. perinatal mood disorders, discussed treatment and gave resources for mental health follow up if concerns arise.  CSW recommends self-evaluation during the postpartum time period using the New Mom Checklist from Postpartum Progress and encouraged MOB to contact a medical professional if symptoms are noted at any time.    CSW provided review of Sudden Infant Death Syndrome (SIDS) precautions.    CSW identifies no further need for intervention and no barriers to discharge at this time.  Abundio Miu, [place] Worker [facility] Cell#: [phone]

## 2020-11-17 NOTE — Lactation Note (Addendum)
This note was copied from a baby's chart. Lactation Consultation Note  Patient Name: Janet Norman [id number] Date: 11/17/2020 Reason for consult: Follow-up assessment;1st time breastfeeding;Early term 37-38.6wks Age:28 hours, -4% weight loss, ETI female infant. Per mom, infant BF for 5 minutes and afterwards was given 18 mls of Similac Neosure with iron 22 kcal at 2000 pm, LC did not observed a latch at this time. Mom is briefly latching 5 minutes or less, she is aware infant may easily tire, mom is offering her EBM first and then  formula afterwards. Mom will continue to follow LPTI feeding guidelines and continue to pump every 3 hours for 15 minutes on initial setting. LC reviewed hand expression with mom, she has rusty pipe syndrome, she expressed 3 mls that she will offer to infant at the next feeding. Mom did compain of pain with 24 mm Breast pump, she will used 27 mm breast flanges when she pumps again to see if it is more comfortable. LC gave mom comfort gels and she knows not to use them with coconut oil or milk balm.  Mom will continue to : 1- BF infant according to LPTI feeding policy. 2- Offer her EBM first after latching infant at the breast and then supplement with formula. 3- Day 3 mom knows to supplement infant with 20 to 30 mls of EBM/ and formula if infant latches, if not latching at breast to offer 30+ mls. 4- Mom will limit infant's total feeding to 30 minutes or less. 5- Mom knows to call [facility] services if she has any BF questions, concerns or need assistance with latching infant at the breast.  Maternal Data    Feeding Mother's Current Feeding Choice: Breast Milk and Formula Nipple Type: Nfant Slow Flow (purple)  LATCH Score                    Lactation Tools Discussed/Used    Interventions Interventions: Skin to skin;Breast compression;Expressed milk;DEBP;Education  Discharge    Consult Status Consult Status: Follow-up Date: 11/18/20 Follow-up  type: In-patient    Janet Norman 11/17/2020, 9:44 PM

## 2020-11-18 ENCOUNTER — Ambulatory Visit: Payer: BC Managed Care – PPO

## 2020-11-18 MED ORDER — NIFEDIPINE ER OSMOTIC RELEASE 30 MG PO TB24
30.0000 mg | ORAL_TABLET | Freq: Two times a day (BID) | ORAL | Status: DC
  Administered 2020-11-18 – 2020-11-19 (×3): 30 mg via ORAL
  Filled 2020-11-18 (×3): qty 1

## 2020-11-18 MED ORDER — NIFEDIPINE ER OSMOTIC RELEASE 30 MG PO TB24
30.0000 mg | ORAL_TABLET | Freq: Every day | ORAL | Status: DC
  Administered 2020-11-18: 30 mg via ORAL
  Filled 2020-11-18: qty 1

## 2020-11-18 NOTE — Progress Notes (Signed)
Subjective: POD# 3 Live born female  Birth Weight: 4 lb 15.7 oz (2260 g) APGAR: 8, 9  Newborn Delivery   Birth date/time: 11/[birth year]/2020 18:39:00 Delivery type: C-Section, Low Transverse C-section categorization: Primary     Baby name: Redmond Baseman Delivering provider: Rhoderick Moody E   Circumcision planning  Feeding: breast and bottle  Pain control at delivery: Epidural   Reports feeling much better this AM. Pain only when moving but able to ambulate without assistance. Denies headache, epigastric pain, or visual changes. Breastfeeding going well. Baby in under bili lights in the room. Husband, Loistine Chance, at the bedside providing support.   Patient reports tolerating PO.   Pain controlled with acetaminophen, ibuprofen (OTC), and narcotic analgesics including oxycodone (Oxycontin, Oxyir) Denies HA/SOB/C/P/N/V/dizziness. She reports vaginal bleeding as normal, without clots. She is ambulating and urinating without difficulty.     Objective:   Vitals:   11/17/20 2108 11/18/20 0006 11/18/20 0413 11/18/20 0810  BP: (!) 148/85 (!) 153/88 (!) 147/95 132/81  Pulse: (!) 101 90 94 90  Resp: 18 17 15 18   Temp: 98.5 F (36.9 C) (!) 97.4 F (36.3 C) 98.2 F (36.8 C)   TempSrc: Oral Axillary Oral   SpO2: 100% 100% 100% 100%  Weight:      Height:         Intake/Output Summary (Last 24 hours) at 11/18/2020 0818 Last data filed at 11/18/2020 0400 Gross per 24 hour  Intake 1600 ml  Output 2600 ml  Net -1000 ml        Recent Labs    11/15/20 2025 11/16/20 0413  WBC 23.2* 21.8*  HGB 10.6* 10.8*  HCT 31.3* 32.2*  PLT 221 223    Blood type: --/--/A POS (06/07 0030)  Rubella: Immune (12/10 0000)   Vaccines:   TDaP          UTD                      COVID-19 Declined  Physical Exam:  General: alert and cooperative CV: Regular rate and rhythm Resp: clear Abdomen: soft, nontender, normal bowel sounds Incision: clean, dry, and intact Uterine Fundus: firm, below umbilicus,  nontender Lochia: minimal Ext: extremities normal, atraumatic, no cyanosis or edema and edema 1+ non-pitting edema to lower extremities  Assessment/Plan: 30 y.o.   POD# 3. 26                  Principal Problem:   Postpartum care following cesarean delivery 6/7  Encourage rest when baby rests Breastfeeding support Encourage to ambulate Routine post-op care Active Problems:   Encounter for induction of labor   Failed induction of labor, NRFHTs   Status post primary low transverse cesarean section 6/7   Preeclampsia, severe  S/P 24 hours of magnesium sulfate, discontinued 6/8 at 2000  PEC labs stable  Denies PEC symptoms  Elevated BP overnight, no severe range  Will start Procardia XL 30mg  daily and watch BP trend today  Close PP F/U for BP   Anxiety  Stable off medication  Close PP F/U  Consult with Dr. 8/8 regarding PEC management. Anticipate discharge tomorrow if BP under good control with PO meds.   , CNM, MSN 11/18/2020, 8:18 AM

## 2020-11-18 NOTE — Lactation Note (Signed)
This note was copied from a baby's chart. Lactation Consultation Note  Patient Name: Boy Jacquel Redditt [id number] Date: 11/18/2020 Reason for consult: Follow-up assessment;Early term 69-38.6wks Age:28 hours  Mom was bottle feeding infant when LC arrived.  She is getting a couple of mls when pumping.  She pumped 3 hours ago and feels the 27 flanges previously suggested were too large and painful for her.  She desires to wait to pump until after she eats breakfast.  Mom inquired about a different flange.   LC placed 21 to breast and there was space around the nipple.  LC requested mom call out with next pumping session in order for LC to observe pump to ensure correct flange fit.  Moms nipples are slightly pink.  She is using earth mama to soothe the nipple.  LC encouraged mom to hand express prior to pumping and use her EBM to coat the flange and nipple then pump.  Pump settings reviewed with mom. Hand expression after pumping was also encouraged.    Mom will call out for lactation assistance when pumping.   Maternal Data    Feeding Mother's Current Feeding Choice: Breast Milk and Formula Nipple Type: Nfant Slow Flow (purple)  LATCH Score                    Lactation Tools Discussed/Used Tools: Pump Flange Size: 24 (21's provided.  Mom has some pain with 24 and when breast assessment done, 21 may be an option.  LC requested for mom to call out for pumping assessment for proper fit.) Breast pump type: Double-Electric Breast Pump Reason for Pumping: stimlulate milk supply/ collect and feed back milk to infant  Interventions    Discharge    Consult Status Consult Status: Follow-up Date: 11/19/20 Follow-up type: In-patient    Maryruth Hancock [facility] 11/18/2020, 9:56 AM

## 2020-11-18 NOTE — Progress Notes (Signed)
BP 145/81 at 1158, then 151/89 at 1615.  Pt denies any headache, visual changes, epigastric pain.  She had 30mg  Procardia XL (first dose) at 0958 this morning.  , CNM aware of BP's and would like to increase the same dose to BID (instead of qD) and pt should get a dose now.    Pt and Sig. Other have had two stressful day as infant had to be on phototherapy yesterday and today was catheterized for VCUG and also there was a possibility of being transferred to [facility].  Catheter d/c now (phototherapy d/c earlier today also) and infant stable and will f/u as an outpt.  They are in much better spirits now but pt states she "needs to rest and get in my own routine at home".    Will continue to monitor.

## 2020-11-19 LAB — COMPREHENSIVE METABOLIC PANEL
ALT: 28 U/L (ref 0–44)
AST: 41 U/L (ref 15–41)
Albumin: 2.8 g/dL — ABNORMAL LOW (ref 3.5–5.0)
Alkaline Phosphatase: 110 U/L (ref 38–126)
Anion gap: 12 (ref 5–15)
BUN: <5 mg/dL — ABNORMAL LOW (ref 6–20)
CO2: 22 mmol/L (ref 22–32)
Calcium: 9.3 mg/dL (ref 8.9–10.3)
Chloride: 104 mmol/L (ref 98–111)
Creatinine, Ser: 0.61 mg/dL (ref 0.44–1.00)
GFR, Estimated: >60 mL/min (ref >60)
Glucose, Bld: 129 mg/dL — ABNORMAL HIGH (ref 70–99)
Potassium: 4.2 mmol/L (ref 3.5–5.1)
Sodium: 138 mmol/L (ref 135–145)
Total Bilirubin: 0.3 mg/dL (ref 0.3–1.2)
Total Protein: 6.6 g/dL (ref 6.5–8.1)

## 2020-11-19 LAB — CBC
HCT: 30.5 % — ABNORMAL LOW (ref 36.0–46.0)
Hemoglobin: 10.1 g/dL — ABNORMAL LOW (ref 12.0–15.0)
MCH: 29.4 pg (ref 26.0–34.0)
MCHC: 33.1 g/dL (ref 30.0–36.0)
MCV: 88.7 fL (ref 80.0–100.0)
Platelets: 303 10*3/uL (ref 150–400)
RBC: 3.44 MIL/uL — ABNORMAL LOW (ref 3.87–5.11)
RDW: 13 % (ref 11.5–15.5)
WBC: 14.9 10*3/uL — ABNORMAL HIGH (ref 4.0–10.5)
nRBC: 0 % (ref 0.0–0.2)

## 2020-11-19 MED ORDER — LABETALOL HCL 100 MG PO TABS
100.0000 mg | ORAL_TABLET | Freq: Three times a day (TID) | ORAL | Status: DC
  Administered 2020-11-19 (×2): 100 mg via ORAL
  Filled 2020-11-19 (×2): qty 1

## 2020-11-19 MED ORDER — SIMETHICONE 80 MG PO CHEW: 80.0000 mg | CHEWABLE_TABLET | Freq: Three times a day (TID) | ORAL | 0 refills | Status: DC

## 2020-11-19 MED ORDER — BUTALBITAL-APAP-CAFFEINE 50-325-40 MG PO TABS
1.0000 | ORAL_TABLET | Freq: Once | ORAL | Status: AC
  Administered 2020-11-19: 1 via ORAL
  Filled 2020-11-19: qty 1

## 2020-11-19 MED ORDER — CLONAZEPAM 0.5 MG PO TABS: 0.2500 mg | ORAL_TABLET | Freq: Three times a day (TID) | ORAL | Status: DC | PRN

## 2020-11-19 MED ORDER — FLUTICASONE PROPIONATE 50 MCG/ACT NA SUSP
2.0000 | Freq: Every day | NASAL | Status: DC
  Administered 2020-11-19: 2 via NASAL
  Filled 2020-11-19: qty 16

## 2020-11-19 MED ORDER — NIFEDIPINE ER 30 MG PO TB24: 30.0000 mg | ORAL_TABLET | Freq: Two times a day (BID) | ORAL | 1 refills | Status: DC

## 2020-11-19 MED ORDER — OXYCODONE HCL 5 MG PO TABS: 5.0000 mg | ORAL_TABLET | ORAL | 0 refills | Status: DC | PRN

## 2020-11-19 MED ORDER — SENNOSIDES-DOCUSATE SODIUM 8.6-50 MG PO TABS: 2.0000 | ORAL_TABLET | Freq: Every day | ORAL | Status: DC

## 2020-11-19 MED ORDER — FLUOXETINE HCL 10 MG PO CAPS: 10.0000 mg | ORAL_CAPSULE | Freq: Every day | ORAL | 3 refills | Status: DC

## 2020-11-19 MED ORDER — BUTALBITAL-APAP-CAFFEINE 50-325-40 MG PO TABS
1.0000 | ORAL_TABLET | Freq: Once | ORAL | Status: AC | PRN
  Administered 2020-11-19: 1 via ORAL
  Filled 2020-11-19: qty 1

## 2020-11-19 MED ORDER — CLONAZEPAM 0.5 MG PO TABS: 0.2500 mg | ORAL_TABLET | Freq: Three times a day (TID) | ORAL | 0 refills | Status: DC | PRN

## 2020-11-19 MED ORDER — IBUPROFEN 600 MG PO TABS: 600.0000 mg | ORAL_TABLET | Freq: Four times a day (QID) | ORAL | 0 refills | Status: DC

## 2020-11-19 MED ORDER — LABETALOL HCL 100 MG PO TABS: 100.0000 mg | ORAL_TABLET | Freq: Three times a day (TID) | ORAL | 1 refills | Status: DC

## 2020-11-19 MED ORDER — CLONAZEPAM 0.25 MG PO TBDP
0.2500 mg | ORAL_TABLET | Freq: Three times a day (TID) | ORAL | Status: DC | PRN
  Administered 2020-11-19 (×2): 0.25 mg via ORAL
  Filled 2020-11-19 (×2): qty 1

## 2020-11-19 MED ORDER — ACETAMINOPHEN 500 MG PO TABS
1000.0000 mg | ORAL_TABLET | Freq: Four times a day (QID) | ORAL | 0 refills | Status: AC
Start: 2020-11-19 — End: ?

## 2020-11-19 MED ORDER — FLUOXETINE HCL 10 MG PO CAPS
10.0000 mg | ORAL_CAPSULE | Freq: Every day | ORAL | Status: DC
  Administered 2020-11-19: 10 mg via ORAL
  Filled 2020-11-19: qty 1

## 2020-11-19 NOTE — Progress Notes (Signed)
Subjective: POD# 4 Live born female  Birth Weight: 4 lb 15.7 oz (2260 g) APGAR: 8, 9  Newborn Delivery   Birth date/time: [birth year]/2020 18:39:00 Delivery type: C-Section, Low Transverse C-section categorization: Primary     Baby name: Redmond Baseman Delivering provider: Rhoderick Moody E   circumcision planned Feeding: breast and bottle  Pain control at delivery: Epidural   Notified by RN this am patient w/ c/o severe HA upon waking up. Was given APAP and NSAID in early AM which did not help the headache. Felt frontal mostly, notes sinus congestion since Monday, now worsening.  Denies N/V/visual changes, RUQ pain.  Fioricet given, patient ate and drank fluids, now notes HA much improved but still with sinus pressure.   Patient reports also anxiety worsening and experienced a panic attack during the night. Has hx of anxiety for which she was treated with SSRI (not sure which ) and took Klonopin small dose as needed. Tried BuSpar last year after MAB and reports made anxiety worse. Off meds during pregnancy. Thinks being in the hospital for so long is driving her anxiety worse.   Pain well controlled, bleeding minimal, voiding large amounts.  Breastfeeding, working with [facility] for pumping, and baby is on supplement also.  Baby has been evaluated for posterior urethral valves which were not seen on VCUG yesterday, but does have moderate reflux  bilaterally R > L.  Will start once daily Amoxicillin prophylaxis and plan to follow-up closely with Dr. Yetta Flock at [facility]. Pecola Leisure has been cleared for discharge today.  Patient desires discharge home if possible.   Objective:   VS:    Vitals:   11/19/20 0409 11/19/20 0431 11/19/20 0451  11/19/20 0724  BP: (!) 159/93 (!) 163/89 (!) 157/94 (!) 146/90  Pulse: (!) 126 (!) 114 (!) 119 (!) 113  Resp: 17   18  Temp: 97.8 F (36.6 C)   98.5 F (36.9 C)  TempSrc: Oral   Oral  SpO2: 100%   100%  Weight:      Height:       CBC Latest Ref Rng & Units 11/19/2020 11/16/2020 11/15/2020  WBC 4.0 - 10.5 K/uL 14.9(H) 21.8(H) 23.2(H)  Hemoglobin 12.0 - 15.0 g/dL 10.1(L) 10.8(L) 10.6(L)  Hematocrit 36.0 - 46.0 % 30.5(L) 32.2(L) 31.3(L)  Platelets 150 - 400 K/uL 303 223 221    CMP Latest Ref Rng & Units 11/19/2020 11/15/2020 11/10/2020  Glucose 70 - 99 mg/dL 884(Z) 660(Y) 87  BUN 6 - 20 mg/dL <3(K) 6 <1(S)  Creatinine 0.44 - 1.00 mg/dL 0.10 9.32 3.55  Sodium 135 - 145 mmol/L 138 133(L) 135  Potassium 3.5 - 5.1 mmol/L 4.2 3.6 3.9  Chloride 98 - 111 mmol/L 104 104 107  CO2 22 - 32 mmol/L 22 19(L) 18(L)  Calcium 8.9 - 10.3 mg/dL 9.3 9.1 9.2  Total Protein 6.5 - 8.1 g/dL 6.6 6.6 6.6  Total Bilirubin 0.3 - 1.2 mg/dL 0.3 0.3 0.4  Alkaline Phos 38 - 126 U/L 110 106 103  AST 15 - 41 U/L 41 23 23  ALT 0 - 44 U/L 28 13 11      Blood type: --/--/A POS (06/07 0030)  Rubella: Immune (12/10 0000)  Vaccines: TDaP          UTD                    COVID-19 declined   Physical Exam:  General: alert, cooperative, and no distress CV: Regular rate, mild tachy, and normal rhythm or without extra heart sounds Resp: clear bilat Abdomen: soft, nontender, normal bowel sounds Incision: clean, dry, and intact Uterine Fundus: firm, below umbilicus, nontender Lochia: minimal Ext: no edema, redness or tenderness in the calves or thighs  Assessment/Plan: 30 y.o.   POD# 4. 26                  Principal Problem:    Postpartum care following cesarean delivery 6/7 Active Problems:   Encounter for induction of labor   Failed induction of labor, NRFHTs   Status post primary low transverse cesarean section 6/7  - stable post op, continue routine care   Preeclampsia, severe  - s/p Mag Sulfate x 24 hrs postpartum  - BP mild range with one severe range during the night, was started on Procardia 30XL yesterday morning and added another 30XL in evening, no improvement in BP overnight, HA present but likely d/t Procardia and sinus pressure, and improved after Fioricet.   - repeat PEC labs today, LFT's sightly increased but still in normal range, BUN/creat wnl.   - diuresing well  - will add labetalol 100 mg PO TID and titrate as needed  - add Flonase and nasal rinses for sinus congestion   Anxiety  - will restart SSRI, recc Prozac 10 mg daily and patient agrees. Add klonopin 0.25 mg TID PRN for panic attacks.   - expect improvement in mild tachy with decreased anxiety.   Patient strongly desiring discharge, will monitor BP trend today and consider discharge if no severe range BP's. Patient able to monitor BP at home and parameters given. Has good support from partner at home.  Plan close f/u in office early  next week.   POC in consult w/ Dr. Alvin Critchley, CNM, MSN 11/19/2020, 9:02 AM

## 2020-11-19 NOTE — Lactation Note (Signed)
This note was copied from a baby's chart. Lactation Consultation Note Mother and baby to d/c today and f/u with Peds on Monday. Infant's output and wt loss are wnl. No maternal barriers to lactation identified. Infant may continue to need supplementation until developmentally ready to bf independently.   Patient Name: Janet Norman [id number] Date: 11/19/2020 Reason for consult: Follow-up assessment;Other (Comment);Late-preterm 34-36.6wks (discharge) Age:28 days  Maternal Data  + Early breast changes Pumping frequently No breast pain or trauma  Feeding Mother's Current Feeding Choice: Breast Milk and Formula Nipple Type: Slow - flow  Lactation Tools Discussed/Used Pumping frequency: q3  Interventions Interventions: Education;Breast feeding basics reviewed;DEBP  Discharge Discharge Education: Engorgement and breast care;Warning signs for feeding baby;Outpatient recommendation Pump: Personal;DEBP Will f/u with LC at [facility]  Consult Status Consult Status: Follow-up Follow-up type: In-patient   Elder Negus, MA IBCLC 11/19/2020, 9:13 AM

## 2020-11-19 NOTE — Discharge Summary (Signed)
OB Discharge Summary  Patient Name: Janet Norman DOB: 1990-10-28 MRN: [id number]  Date of admission: 11/15/2020 Delivering provider: Rhoderick Moody E   Admitting diagnosis: Encounter for induction of labor [Z34.90] Intrauterine pregnancy: [redacted]w[redacted]d     Secondary diagnosis: Patient Active Problem List   Diagnosis Date Noted   Encounter for induction of labor 11/15/2020   Failed induction of labor, NRFHTs 11/15/2020   Status post primary low transverse cesarean section 6/7 11/15/2020   Postpartum care following cesarean delivery 6/7 11/15/2020   Preeclampsia, severe 11/15/2020   Anxiety 11/15/2020   Additional problems:none   Date of discharge: 11/19/2020   Discharge diagnosis: Principal Problem:   Postpartum care following cesarean delivery 6/7 Active Problems:   Encounter for induction of labor   Failed induction of labor, NRFHTs   Status post primary low transverse cesarean section 6/7   Preeclampsia, severe   Anxiety                                                              Post partum procedures: Magnesium Sulfate therapy x 24 hrs, antihypertensive medication initiated PP day 3  Augmentation: Pitocin, Cytotec, and IP Foley Pain control: Epidural  Laceration:None  Episiotomy:None  Complications: None  Hospital course:  Induction of Labor With Cesarean Section   30 y.o. yo G2P0111 at [redacted]w[redacted]d was admitted to  the hospital 11/15/2020 for induction of labor. Patient had a labor course significant for induction of labor due to severe preeclampsia  and IUGR, needed IV labetalol to control severe range blood pressures. Pregnancy complicated by 2 vessel cord, velamentous cord insertion, and bulbous placenta. Fetal intolerance to labor noted and patient remote from dleivery at 4-5 cm dilation. The patient went for cesarean section due to Non-Reassuring FHR. Delivery details are as follows: Membrane Rupture Time/Date: 8:32 AM ,11/15/2020   Delivery Method:C-Section, Low Transverse  Details of operation can be found in separate operative Note.  Patient had a postpartum course complicated by elevated blood pressures returning on postpartum day 3, was started on Procardia 30 XL daily  and and increased to BID same day. Postpartum day 4 we added labetalol 100 mg TID and preeclamptic labs repeated. CMP was significant for mild elevation in liver enzymes from baseline but still within normal range. Her BP remained in mild range by afternoon and patient desired to be discharged home with close follow up in office. She is ambulating, tolerating a regular diet, passing flatus, and urinating well.  Patient is discharged home in stable condition on 11/20/20. Preeclamptic precautions were reviewed with patient, and she was instructed to check BP at home, to call if > 160/110.  Newborn Data: Birth date:11/15/2020  Birth time:6:39 PM  Gender:Female  Living status:Living  Apgars:8 ,9  Weight:2260 g                                Physical exam  Vitals:   11/19/20 1201 11/19/20 1210 11/19/20 1422 11/19/20 1545  BP: (!) 148/96 (!) 143/82 (!) 148/79 (!) 146/90  Pulse: (!) 128 (!) 122 (!) 103 (!) 113  Resp:    18  Temp:  98.2 F (36.8 C)  98.4 F (36.9 C)  TempSrc:    Oral  SpO2:    99%  Weight:      Height:       General: alert, cooperative, and no distress Lochia: appropriate Uterine Fundus: firm Incision: Dressing is clean,  dry, and intact DVT Evaluation: No cords or calf tenderness. No significant calf/ankle edema. Labs: Lab Results  Component Value Date   WBC 14.9 (H) 11/19/2020   HGB 10.1 (L) 11/19/2020   HCT 30.5 (L) 11/19/2020   MCV 88.7 11/19/2020   PLT 303 11/19/2020   CMP Latest Ref Rng & Units 11/19/2020  Glucose 70 - 99 mg/dL 643(P)  BUN 6 - 20 mg/dL <2(R)  Creatinine 5.18 - 1.00 mg/dL 8.41  Sodium 660 - 630 mmol/L 138  Potassium 3.5 - 5.1 mmol/L 4.2  Chloride 98 - 111 mmol/L 104  CO2 22 - 32 mmol/L 22  Calcium 8.9 - 10.3 mg/dL 9.3  Total Protein 6.5 - 8.1 g/dL 6.6  Total Bilirubin 0.3 - 1.2 mg/dL 0.3  Alkaline Phos 38 - 126 U/L 110  AST 15 - 41 U/L 41  ALT 0 - 44 U/L 28   Edinburgh Postnatal Depression Scale Screening Tool 11/17/2020  I have been able to laugh and see the funny side of things. 1  I have looked forward with enjoyment to things. 0  I have blamed myself unnecessarily when things went wrong. 1  I have been anxious or worried for no good reason. 1  I have felt scared or panicky for no good reason. 1  Things have been getting on top of me. 0  I have been so unhappy that I have had difficulty sleeping. 0  I have felt sad or miserable. 0  I have been so  unhappy that I have been crying. 1  The thought of harming myself has occurred to me. 0  Edinburgh Postnatal Depression Scale Total 5   Vaccines: TDaP          UTD                    COVID-19 declined  Discharge instruction:  per After Visit Summary,  [facility] booklet and  "Understanding Mother & Baby Care" hospital booklet  After Visit Meds:  Allergies as of 11/19/2020       Reactions   Red Dye Hives   Husband states reaction limited to food and drinks   Prednisone Hives, Rash   Pseudophed-chlophedianol-gg Swelling, Rash        Medication List     TAKE these medications    acetaminophen 500 MG tablet Commonly known as: TYLENOL Take 2 tablets (1,000 mg total) by mouth every 6 (six) hours.    clonazePAM 0.5 MG tablet Commonly known as: KlonoPIN Take 0.5 tablets (0.25 mg total) by mouth 3 (three) times daily as needed for anxiety.   FLUoxetine 10 MG capsule Commonly known as: PROZAC Take 1 capsule (10 mg total) by mouth daily. Start taking on: November 20, 2020   ibuprofen 600 MG tablet Commonly known as: ADVIL Take 1 tablet (600 mg total) by mouth every 6 (six) hours.   labetalol 100 MG tablet Commonly known as: NORMODYNE Take 1 tablet (100 mg total) by mouth every 8 (eight) hours.   NIFEdipine 30 MG 24 hr tablet Commonly known as: ADALAT CC Take 1 tablet (30 mg total) by mouth 2 (two) times daily.   oxyCODONE 5 MG immediate release tablet Commonly known as: Oxy IR/ROXICODONE Take 1-2 tablets (5-10 mg total) by mouth every 4 (four) hours as needed for moderate pain.   PRENATAL VITAMINS PO Take by mouth.   senna-docusate 8.6-50 MG tablet Commonly known as: Senokot-S Take 2 tablets by mouth daily. Start taking on: November 20, 2020   simethicone 80 MG chewable tablet Commonly known as: MYLICON Chew 1 tablet (80 mg total) by mouth 3 (three) times daily after meals.        Diet: routine diet  Activity: Advance as tolerated. Pelvic rest for 6 weeks.   Postpartum contraception: Not Discussed  Newborn Data: Live born female  Birth Weight: 4 lb 15.7 oz (2260 g) APGAR: 8, 9  Newborn Delivery   Birth date/time: 11/15/2020 18:39:00 Delivery type: C-Section, Low Transverse C-section categorization: Primary      named Redmond Baseman Baby Feeding: Bottle and Breast Disposition:home with mother Circumcision: planned outpatient with urology, baby with renal hydronephrosis and has follow up with [facility].   Delivery Report:  Review the Delivery Report for details.    Follow up:  Follow-up Information     Shea Evans, MD. Schedule an appointment as soon as possible for a visit on 11/21/2020.   Specialty: Obstetrics and Gynecology Why: BP check and repeat  labs. Contact information: [address] [place] [address] [phone]                   Signed: Neta Mends, CNM, MSN 11/19/2020, 6:07 PM

## 2020-11-19 NOTE — Discharge Instructions (Signed)
Lactation outpatient support - home visit  Linda Coppola RN, MHA, IBCLC at [facility]: Lactation Consultant  [email or web address] Mail: [email or web address] Tel: [phone]    Additional resources:  [facility] [email or web address]   Chiropractic specialist   Dr. Leanna Hastings [email or web address]  Craniosacral therapy for baby  Erin Balkind  [email or web address]  

## 2020-12-05 ENCOUNTER — Encounter (HOSPITAL_COMMUNITY): Payer: Self-pay

## 2020-12-05 ENCOUNTER — Other Ambulatory Visit: Payer: Self-pay

## 2020-12-05 ENCOUNTER — Emergency Department (HOSPITAL_COMMUNITY)
Admission: EM | Admit: 2020-12-05 | Discharge: 2020-12-05 | Disposition: A | Discharge status: Home / Self Care | Payer: BC Managed Care – PPO | Attending: Emergency Medicine | Admitting: Emergency Medicine

## 2020-12-05 DIAGNOSIS — R109 Unspecified abdominal pain: Secondary | ICD-10-CM | POA: Diagnosis present

## 2020-12-05 DIAGNOSIS — N132 Hydronephrosis with renal and ureteral calculous obstruction: Secondary | ICD-10-CM | POA: Diagnosis not present

## 2020-12-05 DIAGNOSIS — N200 Calculus of kidney: Secondary | ICD-10-CM

## 2020-12-05 LAB — CBC
HCT: 31.6 % — ABNORMAL LOW (ref 36.0–46.0)
Hemoglobin: 10 g/dL — ABNORMAL LOW (ref 12.0–15.0)
MCH: 28.5 pg (ref 26.0–34.0)
MCHC: 31.6 g/dL (ref 30.0–36.0)
MCV: 90 fL (ref 80.0–100.0)
Platelets: 362 10*3/uL (ref 150–400)
RBC: 3.51 MIL/uL — ABNORMAL LOW (ref 3.87–5.11)
RDW: 12.4 % (ref 11.5–15.5)
WBC: 8.5 10*3/uL (ref 4.0–10.5)
nRBC: 0 % (ref 0.0–0.2)

## 2020-12-05 LAB — COMPREHENSIVE METABOLIC PANEL
ALT: 22 U/L (ref 0–44)
AST: 22 U/L (ref 15–41)
Albumin: 3.8 g/dL (ref 3.5–5.0)
Alkaline Phosphatase: 70 U/L (ref 38–126)
Anion gap: 9 (ref 5–15)
BUN: 17 mg/dL (ref 6–20)
CO2: 24 mmol/L (ref 22–32)
Calcium: 9 mg/dL (ref 8.9–10.3)
Chloride: 102 mmol/L (ref 98–111)
Creatinine, Ser: 0.85 mg/dL (ref 0.44–1.00)
GFR, Estimated: >60 mL/min (ref >60)
Glucose, Bld: 108 mg/dL — ABNORMAL HIGH (ref 70–99)
Potassium: 3.7 mmol/L (ref 3.5–5.1)
Sodium: 135 mmol/L (ref 135–145)
Total Bilirubin: 0.2 mg/dL — ABNORMAL LOW (ref 0.3–1.2)
Total Protein: 7.6 g/dL (ref 6.5–8.1)

## 2020-12-05 LAB — URINALYSIS, ROUTINE W REFLEX MICROSCOPIC
Bilirubin Urine: NEGATIVE
Glucose, UA: NEGATIVE mg/dL
Ketones, ur: NEGATIVE mg/dL
Leukocytes,Ua: NEGATIVE
Nitrite: NEGATIVE
Protein, ur: 100 mg/dL — AB
Specific Gravity, Urine: 1.029 (ref 1.005–1.030)
pH: 5 (ref 5.0–8.0)

## 2020-12-05 MED ORDER — SODIUM CHLORIDE 0.9 % IV BOLUS
1000.0000 mL | Freq: Once | INTRAVENOUS | Status: AC
  Administered 2020-12-05: 1000 mL via INTRAVENOUS

## 2020-12-05 MED ORDER — KETOROLAC TROMETHAMINE 15 MG/ML IJ SOLN
15.0000 mg | Freq: Once | INTRAMUSCULAR | Status: AC
  Administered 2020-12-05: 15 mg via INTRAVENOUS
  Filled 2020-12-05: qty 1

## 2020-12-05 NOTE — Discharge Instructions (Addendum)
You were evaluated in the Emergency Department and after careful evaluation, we did not find any emergent condition requiring admission or further testing in the hospital.  Your exam/testing today was overall reassuring.  Symptoms seem to be due to a kidney stone.  Use Tylenol at home for pain.  Can also use the oxycodone tablets that you have at home.  Recommend follow-up with urology.  Pain should improve or resolve within the next 2 or 3 days.  Please return to the Emergency Department if you experience any worsening of your condition.  Thank you for allowing [place] to be a part of your care.

## 2020-12-05 NOTE — ED Triage Notes (Signed)
Pt presents to ED with complaints of right flank pain since last night. Pt states she has had difficulty urinating since last night also. Pt is 3 weeks post c-section. Pt has history of kidney stones. Pt states she took oxycodone at 0530

## 2020-12-05 NOTE — ED Provider Notes (Signed)
[facility]-EMERGENCY DEPT [facility] Emergency Department Provider Note MRN:  [id number]  Arrival date & time: 12/05/20     Chief Complaint   Flank Pain   History of Present Illness   Janet Norman is a 30 y.o. year-old female with a history of kidney stones presenting to the ED with chief complaint of flank pain.  Location: Right flank Duration: Started about 10 hours ago Onset: Sudden Timing: Constant Description: Lambert Mody, feels similar to prior kidney stones Severity: 3 out of 10 currently Exacerbating/Alleviating Factors: Feels better after oxycodone taken at home Associated Symptoms: Some trouble voiding Pertinent Negatives: Denies fever, no dysuria, no hematuria, no abdominal pain, no other complaints   Review of Systems  A complete 10 system review of systems was obtained and all systems are negative except as noted in the HPI and PMH.   Patient's Health History    Past Medical History:  Diagnosis Date   Anxiety    Kidney stones     Past Surgical History:  Procedure Laterality Date   CESAREAN SECTION N/A 11/15/2020   Procedure: CESAREAN SECTION;  Surgeon: Vick Frees, MD;  Location: [facility] LD ORS;  Service: Obstetrics;  Laterality: N/A;   TONSILLECTOMY      Family History  Problem Relation Age of Onset   Diabetes Mother    Cancer Maternal Grandmother     Social History   Socioeconomic History   Marital status: Married    Spouse name: Not on file   Number of children: Not on file   Years of education: Not on file   Highest education level: Not on file  Occupational History   Not on file  Tobacco Use   Smoking status: Never   Smokeless tobacco: Never  Vaping Use   Vaping Use: Never used  Substance and Sexual Activity   Alcohol use: No   Drug use: No   Sexual activity: Yes    Birth control/protection: None  Other Topics Concern   Not on file  Social History Narrative   ** Merged History Encounter **       Social Determinants of  Health   Financial Resource Strain: Not on file  Food Insecurity: Not on file  Transportation Needs: Not on file  Physical Activity: Not on file  Stress: Not on file  Social Connections: Not on file  Intimate Partner Violence: Not on file     Physical Exam   Vitals:   12/05/20 0800 12/05/20 0900  BP: 119/79 120/84  Pulse: 73 73  Resp: 10 13  Temp:    SpO2: 97% 100%    CONSTITUTIONAL: Well-appearing, NAD NEURO:  Alert and oriented x 3, no focal deficits EYES:  eyes equal and reactive ENT/NECK:  no LAD, no JVD CARDIO: Regular rate, well-perfused, normal S1 and S2 PULM:  CTAB no wheezing or rhonchi GI/GU:  normal bowel sounds, non-distended, non-tender MSK/SPINE:  No gross deformities, no edema SKIN:  no rash, atraumatic PSYCH:  Appropriate speech and behavior  *Additional and/or pertinent findings included in MDM below  Diagnostic and Interventional Summary    EKG Interpretation  Date/Time:    Ventricular Rate:    PR Interval:    QRS Duration:   QT Interval:    QTC Calculation:   R Axis:     Text Interpretation:          Labs Reviewed  CBC - Abnormal; Notable for the following components:      Result Value   RBC 3.51 (*)  Hemoglobin 10.0 (*)    HCT 31.6 (*)    All other components within normal limits  COMPREHENSIVE METABOLIC PANEL - Abnormal; Notable for the following components:   Glucose, Bld 108 (*)    Total Bilirubin 0.2 (*)    All other components within normal limits  URINALYSIS, ROUTINE W REFLEX MICROSCOPIC - Abnormal; Notable for the following components:   APPearance HAZY (*)    Hgb urine dipstick SMALL (*)    Protein, ur 100 (*)    Bacteria, UA RARE (*)    All other components within normal limits  URINE CULTURE    No orders to display    Medications  sodium chloride 0.9 % bolus 1,000 mL (1,000 mLs Intravenous New Bag/Given 12/05/20 0816)  ketorolac (TORADOL) 15 MG/ML injection 15 mg (15 mg Intravenous Given 12/05/20 0824)      Procedures  /  Critical Care Ultrasound ED Renal  Date/Time: 12/05/2020 8:10 AM Performed by: Sabas Sous, MD Authorized by: Sabas Sous, MD   Procedure details:    Indications comment:  Flank pain   Technique:  L kidney and R kidneyImages: archived Left kidney findings:    Intra-abdominal fluid: not identified     Perinephric fluid: not identified     Hydronephrosis: none   Right kidney findings:    Intra-abdominal fluid: not identified     Perinephric fluid: not identified     Hydronephrosis: mild   Comments:     Mild right hydronephrosis  ED Course and Medical Decision Making  I have reviewed the triage vital signs, the nursing notes, and pertinent available records from the EMR.  Listed above are laboratory and imaging tests that I personally ordered, reviewed, and interpreted and then considered in my medical decision making (see below for details).  Suspect recurrent kidney stone.  Patient is 3 weeks removed from C-section, not having any vaginal bleeding or discharge or pain in the abdomen or issues with the incision site.  Pain is currently well controlled, took her postoperative oxycodone.  Will evaluate renal function, check urine for infection, trying to avoid CT imaging, will consider bedside ultrasound.     Labs are reassuring, sending urinalysis for culture but it is nitrite and leuk negative.  Pain well controlled.  Ultrasound suggestive of some mild hydronephrosis on the right side and so kidney stone is favored.  Appropriate for discharge with pain control and urology follow-up.  Elmer Sow. Pilar Plate, MD [facility] Emergency Medicine [facility] [email or web address]  Final Clinical Impressions(s) / ED Diagnoses     ICD-10-CM   1. Kidney stone  N20.0       ED Discharge Orders     None        Discharge Instructions Discussed with and Provided to Patient:     Discharge Instructions      You were evaluated in the Emergency  Department and after careful evaluation, we did not find any emergent condition requiring admission or further testing in the hospital.  Your exam/testing today was overall reassuring.  Symptoms seem to be due to a kidney stone.  Use Tylenol at home for pain.  Can also use the oxycodone tablets that you have at home.  Recommend follow-up with urology.  Pain should improve or resolve within the next 2 or 3 days.  Please return to the Emergency Department if you experience any worsening of your condition.  Thank you for allowing [place] to be a part of your care.  Sabas Sous, MD 12/05/20 1000

## 2020-12-07 LAB — URINE CULTURE: Culture: NO GROWTH

## 2022-01-15 IMAGING — US US MFM FETAL BPP W/O NON-STRESS
1 series · 14 of 28 positions shown · non-contrast
Comparison: none

[Series 1: us mfm fetal bpp w/o non-stress · 37 acquisitions, 14 frames shown]
[im 2/37]
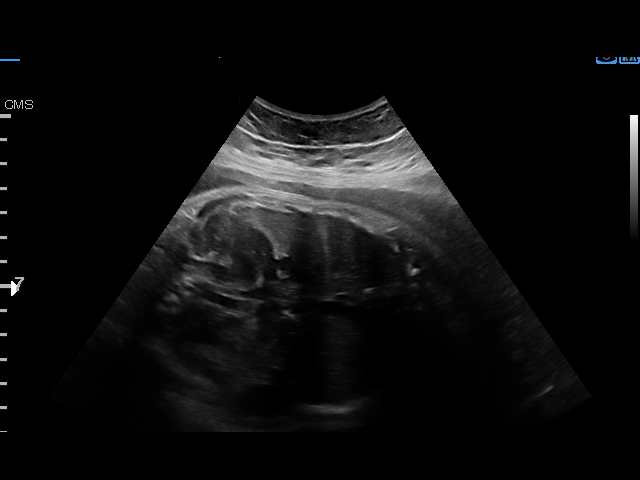
[im 5/37]
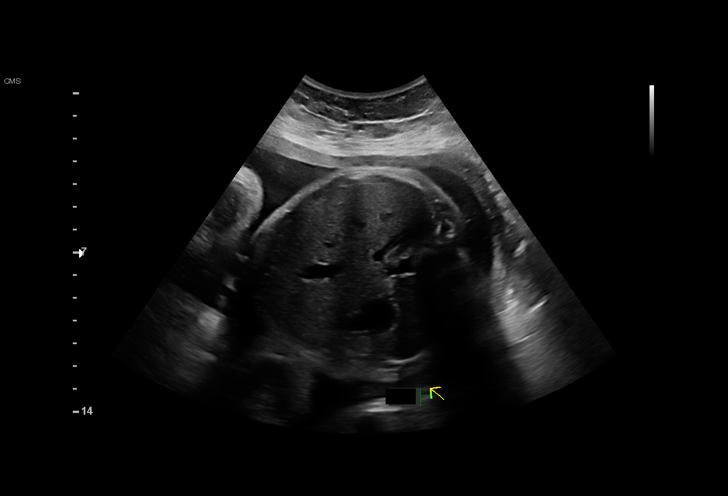
[im 7/37]
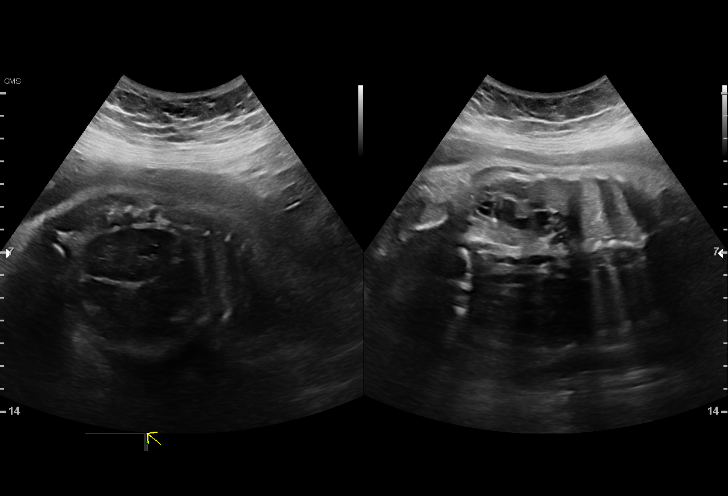
[im 10/37]
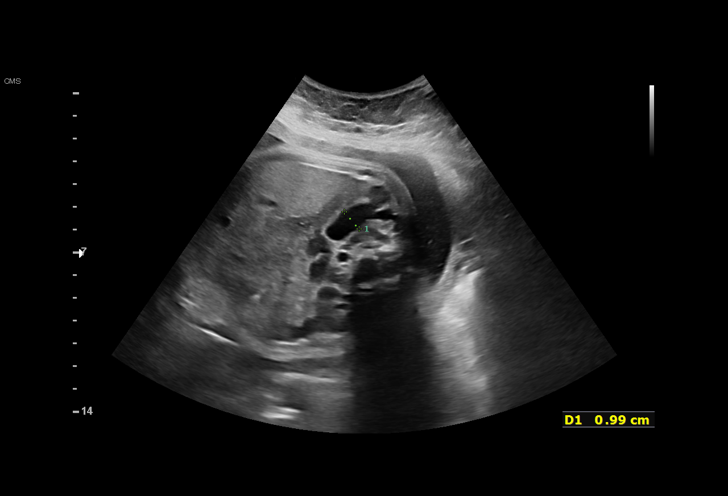
[im 13/37]
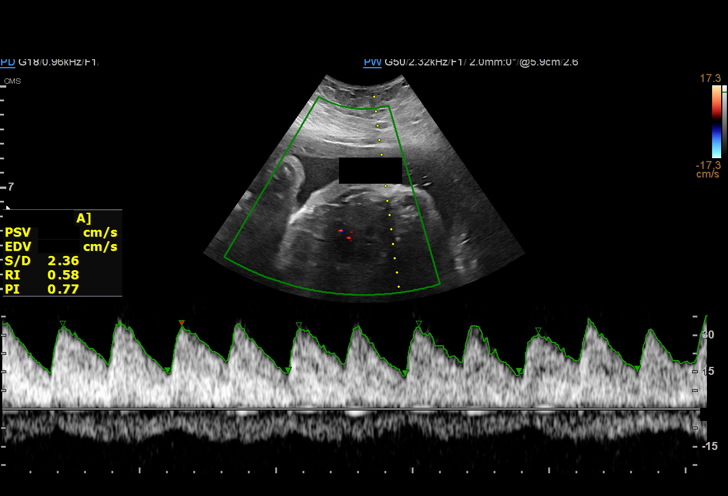
[im 15/37]
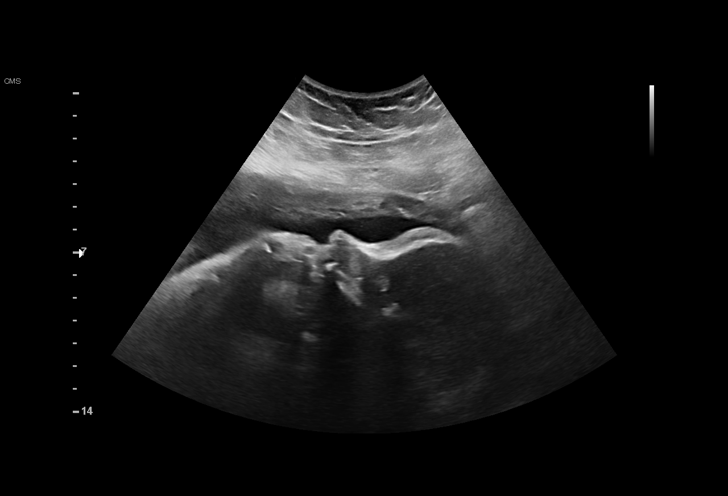
[im 18/37]
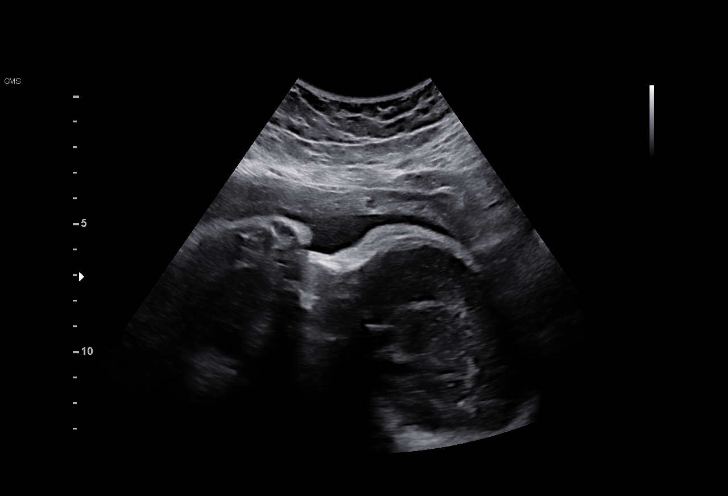
[im 21/37]
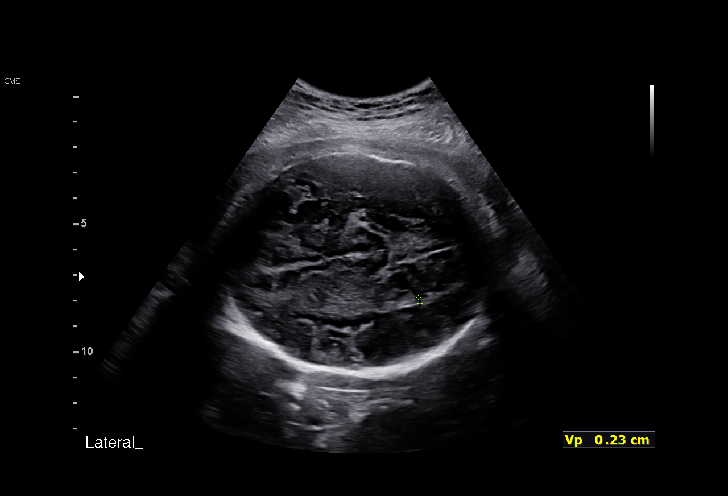
[im 23/37]
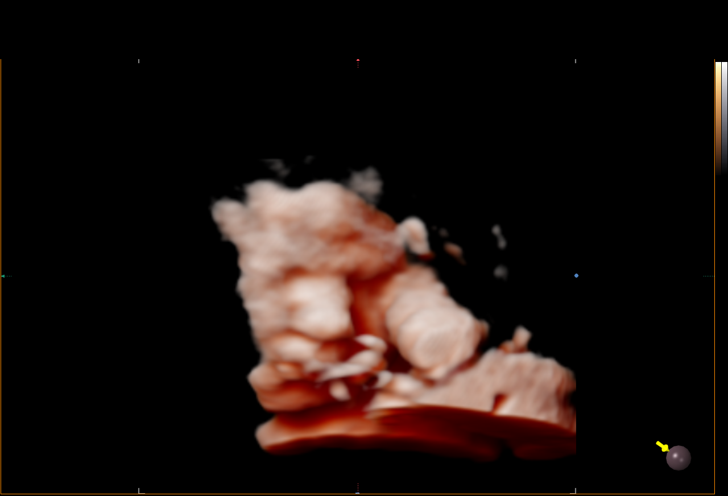
[im 26/37]
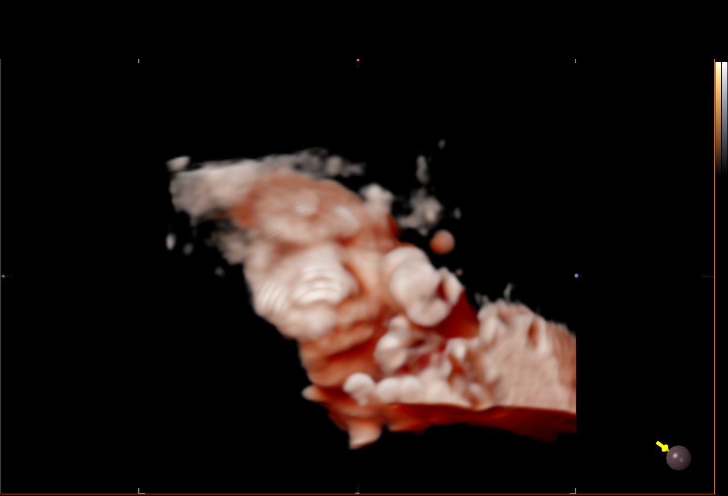
[im 29/37]
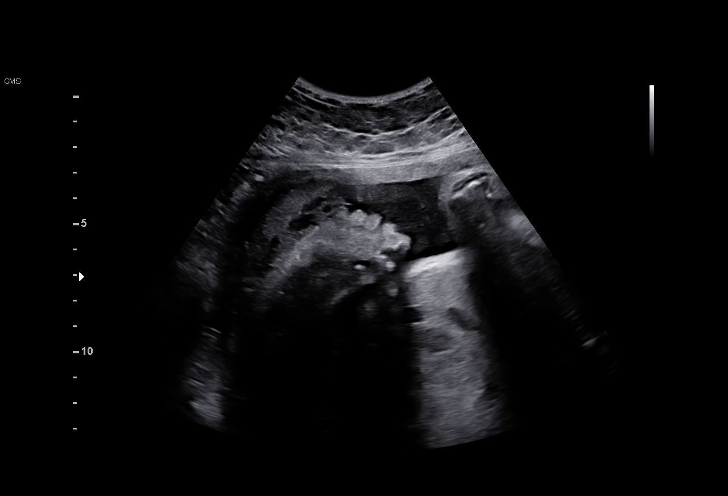
[im 31/37]
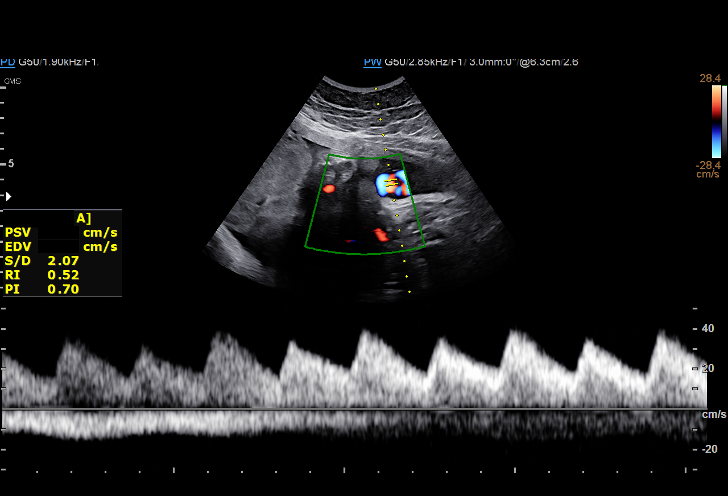
[im 34/37]
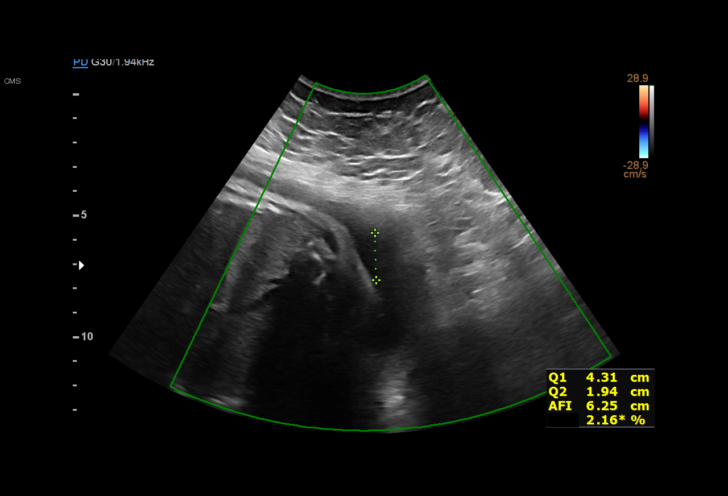
[im 37/37]
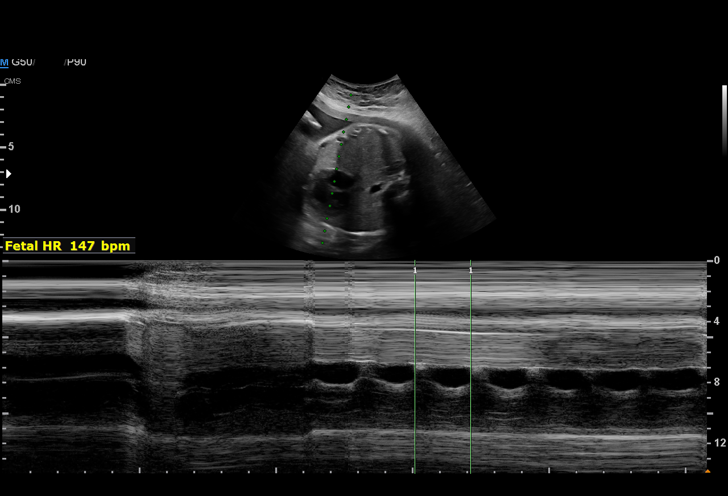

[14 of 28 positions shown; findings below may reference images not displayed]

8402 [HOSPITAL]

Indications

 36 weeks gestation of pregnancy
 Maternal care for known or suspected poor
 fetal growth, third trimester, not applicable or
 unspecified IUGR
 Fetal abnormality - other known or
 suspected (SUA, RT pyelectasis)
 Obesity complicating pregnancy, third
 trimester (Pregravid BMI 35.7)
 Encounter for other antenatal screening
 follow-up
 Velamentous insertion of umbilical cord
 Covid + 07/13/20
 Neg Horizon, LR NIPS
Fetal Evaluation

 Num Of Fetuses:         1
 Fetal Heart Rate(bpm):  147
 Cardiac Activity:       Observed
 Presentation:           Cephalic
 P. Cord Insertion:      Velamentous insertion prev vis

 Amniotic Fluid
 AFI FV:      Within normal limits

 AFI Sum(cm)     %Tile       Largest Pocket(cm)
 12.7            42
 RUQ(cm)       RLQ(cm)       LUQ(cm)        LLQ(cm)

Biophysical Evaluation

 Amniotic F.V:   Within normal limits       F. Tone:        Observed
 F. Movement:    Observed                   Score:          [DATE]
 F. Breathing:   Observed
Biometry

 LV:        2.3  mm
OB History

 Gravidity:    2          SAB:   1
Gestational Age

 LMP:           37w 2d        Date:  02/24/20                 EDD:   11/30/20
 Best:          36w 2d     Det. By:  Early Ultrasound         EDD:   12/07/20
Anatomy

 Ventricles:            Appears normal         Kidneys:                Right UTD
 Stomach:               Appears normal, left   Bladder:                Appears normal
                        sided
Doppler - Fetal Vessels

 Umbilical Artery
  S/D     %tile      RI    %tile      PI    %tile            ADFV    RDFV
  2.25       40    0.56       46    0.77       40               No      No

Impression

 Fetal growth restriction.  Patient return for antenatal testing.
 On ultrasound performed 2 weeks ago, the estimated fetal
 weight was at the 4th percentile.
 Additional findings include single umbilical artery,
 velamentous cord insertion and right urinary tract dilation.
 Amniotic fluid is normal and good fetal activity seen.
 Antenatal testing is reassuring.  BPP [DATE].  Cephalic
 presentation.  Right urinary tract dilation measuring 10 mm is
 seen.  Both kidneys otherwise look normal with no evidence
 of hyperechogenic kidneys.
 Patient will be undergoing induction of labor next week.

                 [place], Phatchareeya

## 2022-06-22 LAB — OB RESULTS CONSOLE GC/CHLAMYDIA
Chlamydia: NEGATIVE
Neisseria Gonorrhea: NEGATIVE

## 2022-06-22 LAB — OB RESULTS CONSOLE HEPATITIS B SURFACE ANTIGEN: Hepatitis B Surface Ag: NEGATIVE

## 2022-06-22 LAB — OB RESULTS CONSOLE RPR: RPR: NONREACTIVE

## 2022-06-22 LAB — OB RESULTS CONSOLE RUBELLA ANTIBODY, IGM: Rubella: IMMUNE

## 2022-06-22 LAB — HEPATITIS C ANTIBODY: HCV Ab: NEGATIVE

## 2022-06-22 LAB — OB RESULTS CONSOLE HIV ANTIBODY (ROUTINE TESTING): HIV: NONREACTIVE

## 2022-12-17 ENCOUNTER — Other Ambulatory Visit: Payer: Self-pay | Admitting: Obstetrics & Gynecology

## 2022-12-18 LAB — OB RESULTS CONSOLE GBS: GBS: NEGATIVE

## 2023-01-02 ENCOUNTER — Encounter (HOSPITAL_COMMUNITY): Payer: Self-pay | Admitting: *Deleted

## 2023-01-02 NOTE — Patient Instructions (Addendum)
Janet Norman  01/02/2023   Your procedure is scheduled on:  01/15/2023  Arrive at 0800 at Entrance C on [employer] [address] at [facility]  and [employer]. You are invited to use the FREE valet parking or use the Visitor's parking deck.  Pick up the phone at the desk and dial [phone].  Call this number if you have problems the morning of surgery: [phone]  Remember:   Do not eat food:(After Midnight) Desps de medianoche.  Do not drink clear liquids: (After Midnight) Desps de medianoche.  Take these medicines the morning of surgery with A SIP OF WATER:  none   Do not wear jewelry, make-up or nail polish.  Do not wear lotions, powders, or perfumes. Do not wear deodorant.  Do not shave 48 hours prior to surgery.  Do not bring valuables to the hospital.  [facility] is not   responsible for any belongings or valuables brought to the hospital.  Contacts, dentures or bridgework may not be worn into surgery.  Leave suitcase in the car. After surgery it may be brought to your room.  For patients admitted to the hospital, checkout time is 11:00 AM the day of              discharge.      Please read over the following fact sheets that you were given:     Preparing for Surgery

## 2023-01-03 ENCOUNTER — Telehealth (HOSPITAL_COMMUNITY): Payer: Self-pay | Admitting: *Deleted

## 2023-01-03 ENCOUNTER — Encounter (HOSPITAL_COMMUNITY): Payer: Self-pay

## 2023-01-03 NOTE — Telephone Encounter (Signed)
Preadmission screen  

## 2023-01-04 ENCOUNTER — Telehealth (HOSPITAL_COMMUNITY): Payer: Self-pay | Admitting: *Deleted

## 2023-01-04 NOTE — Telephone Encounter (Signed)
Preadmission screen  

## 2023-01-07 ENCOUNTER — Encounter (HOSPITAL_COMMUNITY): Payer: Self-pay

## 2023-01-11 ENCOUNTER — Inpatient Hospital Stay (HOSPITAL_COMMUNITY)
Admission: AD | Admit: 2023-01-11 | Discharge: 2023-01-14 | DRG: 787 | Disposition: A | Discharge status: Home / Self Care | Payer: BC Managed Care – PPO | Attending: Obstetrics & Gynecology | Admitting: Obstetrics & Gynecology

## 2023-01-11 ENCOUNTER — Encounter (HOSPITAL_COMMUNITY): Payer: Self-pay | Admitting: Obstetrics

## 2023-01-11 DIAGNOSIS — O34211 Maternal care for low transverse scar from previous cesarean delivery: Principal | ICD-10-CM | POA: Diagnosis present

## 2023-01-11 DIAGNOSIS — Z3A39 39 weeks gestation of pregnancy: Secondary | ICD-10-CM

## 2023-01-11 DIAGNOSIS — O9081 Anemia of the puerperium: Secondary | ICD-10-CM | POA: Diagnosis not present

## 2023-01-11 DIAGNOSIS — F419 Anxiety disorder, unspecified: Secondary | ICD-10-CM | POA: Diagnosis present

## 2023-01-11 DIAGNOSIS — O99214 Obesity complicating childbirth: Secondary | ICD-10-CM | POA: Diagnosis present

## 2023-01-11 DIAGNOSIS — O99344 Other mental disorders complicating childbirth: Secondary | ICD-10-CM | POA: Diagnosis present

## 2023-01-11 DIAGNOSIS — O26893 Other specified pregnancy related conditions, third trimester: Secondary | ICD-10-CM | POA: Diagnosis present

## 2023-01-11 DIAGNOSIS — Z98891 History of uterine scar from previous surgery: Secondary | ICD-10-CM

## 2023-01-11 DIAGNOSIS — D62 Acute posthemorrhagic anemia: Secondary | ICD-10-CM | POA: Diagnosis not present

## 2023-01-11 LAB — CBC
HCT: 29.8 % — ABNORMAL LOW (ref 36.0–46.0)
Hemoglobin: 9.7 g/dL — ABNORMAL LOW (ref 12.0–15.0)
MCH: 25.4 pg — ABNORMAL LOW (ref 26.0–34.0)
MCHC: 32.6 g/dL (ref 30.0–36.0)
MCV: 78 fL — ABNORMAL LOW (ref 80.0–100.0)
Platelets: 270 10*3/uL (ref 150–400)
RBC: 3.82 MIL/uL — ABNORMAL LOW (ref 3.87–5.11)
RDW: 14.5 % (ref 11.5–15.5)
WBC: 10.9 10*3/uL — ABNORMAL HIGH (ref 4.0–10.5)
nRBC: 0 % (ref 0.0–0.2)

## 2023-01-11 LAB — POCT FERN TEST: POCT Fern Test: POSITIVE

## 2023-01-11 LAB — TYPE AND SCREEN
ABO/RH(D): A POS
Antibody Screen: NEGATIVE

## 2023-01-11 LAB — HIV ANTIBODY (ROUTINE TESTING W REFLEX): HIV Screen 4th Generation wRfx: NONREACTIVE

## 2023-01-11 MED ORDER — LIDOCAINE HCL (PF) 1 % IJ SOLN: 30.0000 mL | INTRAMUSCULAR | Status: DC | PRN

## 2023-01-11 MED ORDER — LACTATED RINGERS IV SOLN
500.0000 mL | INTRAVENOUS | Status: DC | PRN
  Administered 2023-01-12 (×2): 500 mL via INTRAVENOUS

## 2023-01-11 MED ORDER — SOD CITRATE-CITRIC ACID 500-334 MG/5ML PO SOLN
30.0000 mL | ORAL | Status: DC | PRN
  Administered 2023-01-12: 30 mL via ORAL
  Filled 2023-01-11: qty 30

## 2023-01-11 MED ORDER — ESCITALOPRAM OXALATE 10 MG PO TABS
5.0000 mg | ORAL_TABLET | Freq: Every day | ORAL | Status: DC
  Administered 2023-01-11 – 2023-01-12 (×2): 5 mg via ORAL
  Filled 2023-01-11 (×2): qty 0.5
  Filled 2023-01-11: qty 1

## 2023-01-11 MED ORDER — OXYCODONE-ACETAMINOPHEN 5-325 MG PO TABS: 1.0000 | ORAL_TABLET | ORAL | Status: DC | PRN

## 2023-01-11 MED ORDER — ACETAMINOPHEN 325 MG PO TABS
650.0000 mg | ORAL_TABLET | ORAL | Status: DC | PRN
  Administered 2023-01-12: 650 mg via ORAL
  Filled 2023-01-11: qty 2

## 2023-01-11 MED ORDER — FENTANYL CITRATE (PF) 100 MCG/2ML IJ SOLN: 100.0000 ug | INTRAMUSCULAR | Status: DC | PRN

## 2023-01-11 MED ORDER — FLEET ENEMA 7-19 GM/118ML RE ENEM: 1.0000 | ENEMA | RECTAL | Status: DC | PRN

## 2023-01-11 MED ORDER — LACTATED RINGERS IV SOLN: INTRAVENOUS | Status: DC

## 2023-01-11 MED ORDER — OXYTOCIN-SODIUM CHLORIDE 30-0.9 UT/500ML-% IV SOLN: 2.5000 [IU]/h | INTRAVENOUS | Status: DC

## 2023-01-11 MED ORDER — OXYCODONE-ACETAMINOPHEN 5-325 MG PO TABS: 2.0000 | ORAL_TABLET | ORAL | Status: DC | PRN

## 2023-01-11 MED ORDER — OXYTOCIN BOLUS FROM INFUSION: 333.0000 mL | Freq: Once | INTRAVENOUS | Status: DC

## 2023-01-11 MED ORDER — ONDANSETRON HCL 4 MG/2ML IJ SOLN
4.0000 mg | Freq: Four times a day (QID) | INTRAMUSCULAR | Status: DC | PRN
  Administered 2023-01-12 (×2): 4 mg via INTRAVENOUS
  Filled 2023-01-11 (×2): qty 2

## 2023-01-11 NOTE — H&P (Signed)
Janet Norman is a 32 y.o. female [phone] [redacted]w[redacted]d presenting for SROM/early labor  Patient reports large gush of fluid around 9:30 this morning. Continuous clear fluid since that time. Having some irregular contractions since that time. No VB. Good FM throughout.  History of primary cesarean section x 1 for NRFHT with pregnancy complicated by 2VC, gHTN, and severe IUGR. Desires TOLAC with spontaneous labor. Had repeat cesarean section scheduled for next week if no labor by then.  Pregnancy dated by LMP c/w 8w sono. Prenatal care at [facility] with Dr. Juliene Pina as primary. Routine prenatal labs WNL. Patient had low risk NIPS, negative AFP1 screen, and normal fetal anatomy scan. Has been on risk reducing bbASA with normal BP's in this pregnancy. Normal 1hr GTT 100 and mild anemia Hgb 10.9 at third trimester screen. Most recent growth US performed at 32.6 showed vertex presentation with an EFW 5#5 (86%) and posterior palcenta. GBS screen negative. PMH +anxiety on Lexapro.  Husband Janet Norman at bedside. They are expecting a baby girl "Janet Norman"  OB History     Gravida  3   Para  1   Term      Preterm  1   AB  1   Living  1      SAB  1   IAB      Ectopic      Multiple  0   Live Births  1          Past Medical History:  Diagnosis Date   Anxiety    History of pre-eclampsia in prior pregnancy, currently pregnant    Kidney stones    Past Surgical History:  Procedure Laterality Date   CESAREAN SECTION N/A 11/15/2020   Procedure: CESAREAN SECTION;  Surgeon: Vick Frees, MD;  Location: [facility] LD ORS;  Service: Obstetrics;  Laterality: N/A;   TONSILLECTOMY     Family History: family history includes Cancer in her maternal grandmother; Diabetes in her mother; Heart attack in her father and mother. Social History:  reports that she has never smoked. She has never used smokeless tobacco. She reports that she does not drink alcohol and does not use drugs.     Maternal  Diabetes: No Genetic Screening: Normal Maternal Ultrasounds/Referrals: Normal Fetal Ultrasounds or other Referrals:  None Maternal Substance Abuse:  No Significant Maternal Medications:  Meds include: Other:  Significant Maternal Lab Results:  Group B Strep negative Other Comments:  None  Review of Systems  All other systems reviewed and are negative.  Per HPI Maternal Exam:  Uterine Assessment: Contraction strength is mild.  Contraction frequency is irregular.  Abdomen: Patient reports no abdominal tenderness. Surgical scars: low transverse.   Estimated fetal weight is 7#4.   Fetal presentation: vertex Introitus: Normal vulva. Normal vagina.  Amniotic fluid character: clear. Pelvis: adequate for delivery.   Cervix: Cervix evaluated by digital exam.     Fetal Exam Fetal State Assessment: Category I - tracings are normal.   Physical Exam Vitals reviewed.  Constitutional:      Appearance: Normal appearance.  HENT:     Head: Normocephalic.  Cardiovascular:     Rate and Rhythm: Normal rate.  Pulmonary:     Effort: Pulmonary effort is normal.  Genitourinary:    General: Normal vulva.  Musculoskeletal:        General: Normal range of motion.     Cervical back: Normal range of motion.  Skin:    General: Skin is warm and dry.  Neurological:  General: No focal deficit present.     Mental Status: She is alert and oriented to person, place, and time.  Psychiatric:        Mood and Affect: Mood normal.        Behavior: Behavior normal.     Dilation: 1.5 Exam by:: Dorathy Daft, CNM Blood pressure 131/80, pulse 99, temperature 97.9 F (36.6 C), temperature source Oral, resp. rate 18, height 5\' 2"  (1.575 m), weight 102.1 kg, SpO2 100%, not currently breastfeeding.  Prenatal labs: ABO, Rh:  --/--/PENDING (08/02 1418) Antibody: PENDING (08/02 1418) Rubella: Immune (01/12 0000) RPR: Nonreactive (01/12 0000)  HBsAg: Negative (01/12 0000)  HIV: Non-reactive (01/12 0000)   GBS: Negative/-- (07/09 0000)   ChemistryNo results for input(s): "NA", "K", "CL", "CO2", "GLUCOSE", "BUN", "CREATININE", "CALCIUM", "GFRNONAA", "GFRAA", "ANIONGAP" in the last 168 hours.  No results for input(s): "PROT", "ALBUMIN", "AST", "ALT", "ALKPHOS", "BILITOT" in the last 168 hours. Hematology Recent Labs  Lab 01/11/23 1347  WBC 10.9*  RBC 3.82*  HGB 9.7*  HCT 29.8*  MCV 78.0*  MCH 25.4*  MCHC 32.6  RDW 14.5  PLT 270   Cardiac EnzymesNo results for input(s): "TROPONINI" in the last 168 hours. No results for input(s): "TROPIPOC" in the last 168 hours.  BNPNo results for input(s): "BNP", "PROBNP" in the last 168 hours.  DDimer No results for input(s): "DDIMER" in the last 168 hours.   Assessment/Plan: Janet Norman is a 32 y.o. female [phone] [redacted]w[redacted]d admitted with early labor for TOLAC.   Patient counseled at bedside regarding risks and benefits of TOLAC versus repeat cesarean section. We reviewed rare but possible risk of uterine rupture which is associated with increased infant and maternal mortality and morbidity. Patient verbalizes good understanding of risks, questions answered, and elects to proceed with TOLAC at this time. TOLAC consents signed at the bedside.  -Admit to LD -Routine admission labs -Continuous EFM/Toco -GBS NEG -TOLAC: augmentation with Foley balloon, placed. Discussed possible Pitocin augmentation -May have IV pain meds/Epidural for labor pain mgmt on request -Anxiety cont Lexapro -Routine intrapartum care -Working towards VBAC   A  01/11/2023, 2:49 PM

## 2023-01-11 NOTE — Progress Notes (Signed)
Progress Note  S/O: Pt resting in bed with husband at bedside. S/p balloon coming out, reports contractions as more uncomfortable but irregular. Not leaking as much fluid as she was before  Vitals:   01/11/23 1858 01/11/23 2121  BP: 137/81 126/79  Pulse: 89 87  Resp:  17  Temp:  98.2 F (36.8 C)  SpO2:      SVE: 4-5/70/-2 forebag present  EFM: Cat I baseline 130 bpm mod var, +accels, -decels Toco: Ctxs irregular q 2-10 min  A/P: 32Y G3P1011 @ 39.6 TOLAC  -TOLAC: s/p SROM 9:30 this morning presenting in early labor, s/p Foley balloon came out just now, with forebag present, pt hoping to avoid Pitocin augmentation if possible. Consented for AROM of forebag-- clear fluid, will allow for 1-2 hours to see if ctxs pick up, if ctxs remain irregular pt agrees to Pitocin augmentation: 1x1 per protocol -Cont EFM/Toco: Cat I -GBS NEG -Patient may have IV meds/Epidural if requested -Routine intrapartum care -Workings towards VBAC   A  01/11/23 10:00 PM

## 2023-01-11 NOTE — MAU Note (Signed)
..  Janet Norman is a 32 y.o. at [redacted]w[redacted]d here in MAU reporting: a gush of fluid around 0930 and trickling since then. Reports some pelvic pressure denies contractions. +FM  Previous c-section, desires VBAC. GBS- Pain score: 3 pelvic pressure Vitals:   01/11/23 1315  BP: 131/80  Pulse: 99  Resp: 18  Temp: 97.9 F (36.6 C)  SpO2: 100%     NGE:[id number] in room 140's Lab orders placed from triage:  fern

## 2023-01-11 NOTE — MAU Provider Note (Signed)
S: Ms. Janet Norman is a 32 y.o. [id number] at [redacted]w[redacted]d  who presents to MAU today complaining of leaking of fluid since this morning . She denies vaginal bleeding. She denies contractions. She reports normal fetal movement.    O: BP 131/80 (BP Location: Right Arm)   Pulse 99   Temp 97.9 F (36.6 C) (Oral)   Resp 18   Ht 5\' 2"  (1.575 m)   Wt 102.1 kg   SpO2 100%   Breastfeeding No   BMI 41.15 kg/m  GENERAL: Well-developed, well-nourished female in no acute distress.  HEAD: Normocephalic, atraumatic.  CHEST: Normal effort of breathing, regular heart rate ABDOMEN: Soft, nontender, gravid PELVIC: Normal external female genitalia. Vagina is pink and rugated. Cervix with normal contour, no lesions. Normal discharge.  Positive for pooling.   Cervical exam:  Dilation: 1.5 Exam by:: Dorathy Daft, CNM   Fetal Monitoring: Baseline: 135 bpm  Variability: moderate Accelerations: present  Decelerations: absent  Contractions: UI   Fern positive    A: [facility] at [redacted]w[redacted]d  SROM  P: Report given to RN to contact MD on call for further instructions  Carlynn Herald, CNM 01/11/2023 2:00 PM

## 2023-01-12 ENCOUNTER — Inpatient Hospital Stay (HOSPITAL_COMMUNITY): Payer: BC Managed Care – PPO | Admitting: Anesthesiology

## 2023-01-12 ENCOUNTER — Other Ambulatory Visit: Payer: Self-pay

## 2023-01-12 ENCOUNTER — Encounter (HOSPITAL_COMMUNITY): Payer: Self-pay | Admitting: Obstetrics and Gynecology

## 2023-01-12 ENCOUNTER — Encounter (HOSPITAL_COMMUNITY)
Admission: AD | Disposition: A | Discharge status: Home / Self Care | Payer: Self-pay | Source: Home / Self Care | Attending: Obstetrics & Gynecology

## 2023-01-12 DIAGNOSIS — Z98891 History of uterine scar from previous surgery: Secondary | ICD-10-CM

## 2023-01-12 SURGERY — Surgical Case
Anesthesia: Epidural

## 2023-01-12 MED ORDER — LACTATED RINGERS IV SOLN
500.0000 mL | Freq: Once | INTRAVENOUS | Status: AC
  Administered 2023-01-12: 500 mL via INTRAVENOUS

## 2023-01-12 MED ORDER — CEFAZOLIN SODIUM-DEXTROSE 2-3 GM-%(50ML) IV SOLR
INTRAVENOUS | Status: DC | PRN
  Administered 2023-01-12: 2 g via INTRAVENOUS

## 2023-01-12 MED ORDER — SODIUM BICARBONATE 8.4 % IV SOLN
INTRAVENOUS | Status: DC | PRN
  Administered 2023-01-12: 4 mL via EPIDURAL
  Administered 2023-01-12: 10 mL via EPIDURAL

## 2023-01-12 MED ORDER — AMISULPRIDE (ANTIEMETIC) 5 MG/2ML IV SOLN: 10.0000 mg | Freq: Once | INTRAVENOUS | Status: DC | PRN

## 2023-01-12 MED ORDER — METOCLOPRAMIDE HCL 5 MG/ML IJ SOLN
INTRAMUSCULAR | Status: AC
  Filled 2023-01-12: qty 2

## 2023-01-12 MED ORDER — MEPERIDINE HCL 25 MG/ML IJ SOLN: 6.2500 mg | INTRAMUSCULAR | Status: DC | PRN

## 2023-01-12 MED ORDER — ONDANSETRON HCL 4 MG/2ML IJ SOLN: 4.0000 mg | Freq: Once | INTRAMUSCULAR | Status: DC | PRN

## 2023-01-12 MED ORDER — PHENYLEPHRINE 80 MCG/ML (10ML) SYRINGE FOR IV PUSH (FOR BLOOD PRESSURE SUPPORT): 80.0000 ug | PREFILLED_SYRINGE | INTRAVENOUS | Status: DC | PRN

## 2023-01-12 MED ORDER — DEXAMETHASONE SODIUM PHOSPHATE 10 MG/ML IJ SOLN
INTRAMUSCULAR | Status: AC
  Filled 2023-01-12: qty 1

## 2023-01-12 MED ORDER — WITCH HAZEL-GLYCERIN EX PADS: 1.0000 | MEDICATED_PAD | CUTANEOUS | Status: DC | PRN

## 2023-01-12 MED ORDER — KETOROLAC TROMETHAMINE 30 MG/ML IJ SOLN
INTRAMUSCULAR | Status: AC
  Filled 2023-01-12: qty 1

## 2023-01-12 MED ORDER — OXYTOCIN-SODIUM CHLORIDE 30-0.9 UT/500ML-% IV SOLN
1.0000 m[IU]/min | INTRAVENOUS | Status: DC
  Administered 2023-01-12: 1 m[IU]/min via INTRAVENOUS
  Filled 2023-01-12: qty 500

## 2023-01-12 MED ORDER — MENTHOL 3 MG MT LOZG: 1.0000 | LOZENGE | OROMUCOSAL | Status: DC | PRN

## 2023-01-12 MED ORDER — KETOROLAC TROMETHAMINE 30 MG/ML IJ SOLN
INTRAMUSCULAR | Status: DC | PRN
  Administered 2023-01-12: 30 mg via INTRAVENOUS

## 2023-01-12 MED ORDER — DIPHENHYDRAMINE HCL 50 MG/ML IJ SOLN: 12.5000 mg | INTRAMUSCULAR | Status: DC | PRN

## 2023-01-12 MED ORDER — FENTANYL-BUPIVACAINE-NACL 0.5-0.125-0.9 MG/250ML-% EP SOLN
12.0000 mL/h | EPIDURAL | Status: DC | PRN
  Administered 2023-01-12 (×2): 12 mL/h via EPIDURAL
  Filled 2023-01-12 (×2): qty 250

## 2023-01-12 MED ORDER — EPHEDRINE 5 MG/ML INJ: 10.0000 mg | INTRAVENOUS | Status: DC | PRN

## 2023-01-12 MED ORDER — FENTANYL CITRATE (PF) 100 MCG/2ML IJ SOLN
INTRAMUSCULAR | Status: DC | PRN
  Administered 2023-01-12: 100 ug via EPIDURAL

## 2023-01-12 MED ORDER — LACTATED RINGERS AMNIOINFUSION: INTRAVENOUS | Status: DC

## 2023-01-12 MED ORDER — OXYCODONE HCL 5 MG/5ML PO SOLN: 5.0000 mg | Freq: Once | ORAL | Status: DC | PRN

## 2023-01-12 MED ORDER — LIDOCAINE HCL (PF) 1 % IJ SOLN
INTRAMUSCULAR | Status: DC | PRN
  Administered 2023-01-12 (×2): 4 mL via EPIDURAL

## 2023-01-12 MED ORDER — ONDANSETRON HCL 4 MG/2ML IJ SOLN
INTRAMUSCULAR | Status: DC | PRN
  Administered 2023-01-12: 4 mg via INTRAVENOUS

## 2023-01-12 MED ORDER — DIBUCAINE (PERIANAL) 1 % EX OINT: 1.0000 | TOPICAL_OINTMENT | CUTANEOUS | Status: DC | PRN

## 2023-01-12 MED ORDER — SIMETHICONE 80 MG PO CHEW
80.0000 mg | CHEWABLE_TABLET | Freq: Three times a day (TID) | ORAL | Status: DC
  Administered 2023-01-13 – 2023-01-14 (×4): 80 mg via ORAL
  Filled 2023-01-12 (×4): qty 1

## 2023-01-12 MED ORDER — KETOROLAC TROMETHAMINE 30 MG/ML IJ SOLN
30.0000 mg | Freq: Four times a day (QID) | INTRAMUSCULAR | Status: DC
  Administered 2023-01-12: 30 mg via INTRAVENOUS
  Filled 2023-01-12 (×2): qty 1

## 2023-01-12 MED ORDER — OXYCODONE HCL 5 MG PO TABS
5.0000 mg | ORAL_TABLET | ORAL | Status: DC | PRN
  Administered 2023-01-13 – 2023-01-14 (×5): 5 mg via ORAL
  Filled 2023-01-12 (×5): qty 1

## 2023-01-12 MED ORDER — OXYTOCIN-SODIUM CHLORIDE 30-0.9 UT/500ML-% IV SOLN
INTRAVENOUS | Status: DC | PRN
  Administered 2023-01-12: 30 [IU] via INTRAVENOUS

## 2023-01-12 MED ORDER — ONDANSETRON HCL 4 MG/2ML IJ SOLN
INTRAMUSCULAR | Status: AC
  Filled 2023-01-12: qty 2

## 2023-01-12 MED ORDER — HYDROMORPHONE HCL 1 MG/ML IJ SOLN: 0.2000 mg | INTRAMUSCULAR | Status: DC | PRN

## 2023-01-12 MED ORDER — FENTANYL CITRATE (PF) 100 MCG/2ML IJ SOLN
INTRAMUSCULAR | Status: AC
  Filled 2023-01-12: qty 2

## 2023-01-12 MED ORDER — HYDROMORPHONE HCL 1 MG/ML IJ SOLN: 0.2500 mg | INTRAMUSCULAR | Status: DC | PRN

## 2023-01-12 MED ORDER — SIMETHICONE 80 MG PO CHEW: 80.0000 mg | CHEWABLE_TABLET | ORAL | Status: DC | PRN

## 2023-01-12 MED ORDER — DEXAMETHASONE SODIUM PHOSPHATE 10 MG/ML IJ SOLN
INTRAMUSCULAR | Status: DC | PRN
  Administered 2023-01-12: 10 mg via INTRAVENOUS

## 2023-01-12 MED ORDER — IBUPROFEN 600 MG PO TABS: 600.0000 mg | ORAL_TABLET | Freq: Four times a day (QID) | ORAL | Status: DC

## 2023-01-12 MED ORDER — OXYCODONE HCL 5 MG PO TABS: 5.0000 mg | ORAL_TABLET | Freq: Once | ORAL | Status: DC | PRN

## 2023-01-12 MED ORDER — SODIUM CHLORIDE 0.9 % IV SOLN
INTRAVENOUS | Status: DC | PRN
  Administered 2023-01-12: 500 mg via INTRAVENOUS

## 2023-01-12 MED ORDER — MORPHINE SULFATE (PF) 0.5 MG/ML IJ SOLN
INTRAMUSCULAR | Status: DC | PRN
  Administered 2023-01-12: 3 mg via EPIDURAL

## 2023-01-12 MED ORDER — MORPHINE SULFATE (PF) 0.5 MG/ML IJ SOLN
INTRAMUSCULAR | Status: AC
  Filled 2023-01-12: qty 10

## 2023-01-12 MED ORDER — KETOROLAC TROMETHAMINE 30 MG/ML IJ SOLN: 30.0000 mg | Freq: Once | INTRAMUSCULAR | Status: DC | PRN

## 2023-01-12 MED ORDER — METOCLOPRAMIDE HCL 5 MG/ML IJ SOLN
INTRAMUSCULAR | Status: DC | PRN
  Administered 2023-01-12: 10 mg via INTRAVENOUS

## 2023-01-12 MED ORDER — COCONUT OIL OIL: 1.0000 | TOPICAL_OIL | Status: DC | PRN

## 2023-01-12 MED ORDER — ACETAMINOPHEN 500 MG PO TABS
1000.0000 mg | ORAL_TABLET | Freq: Four times a day (QID) | ORAL | Status: DC
  Administered 2023-01-12 – 2023-01-14 (×7): 1000 mg via ORAL
  Filled 2023-01-12 (×7): qty 2

## 2023-01-12 MED ORDER — OXYTOCIN-SODIUM CHLORIDE 30-0.9 UT/500ML-% IV SOLN: 2.5000 [IU]/h | INTRAVENOUS | Status: AC

## 2023-01-12 MED ORDER — PHENYLEPHRINE 80 MCG/ML (10ML) SYRINGE FOR IV PUSH (FOR BLOOD PRESSURE SUPPORT)
PREFILLED_SYRINGE | INTRAVENOUS | Status: DC | PRN
  Administered 2023-01-12 (×5): 80 ug via INTRAVENOUS

## 2023-01-12 MED ORDER — DIPHENHYDRAMINE HCL 25 MG PO CAPS: 25.0000 mg | ORAL_CAPSULE | Freq: Four times a day (QID) | ORAL | Status: DC | PRN

## 2023-01-12 MED ORDER — ACETAMINOPHEN 10 MG/ML IV SOLN
INTRAVENOUS | Status: DC | PRN
Start: 2023-01-12 — End: 2023-01-12
  Administered 2023-01-12: 1000 mg via INTRAVENOUS

## 2023-01-12 SURGICAL SUPPLY — 38 items
APL PRP STRL LF DISP 70% ISPRP (MISCELLANEOUS) ×2
APL SKNCLS STERI-STRIP NONHPOA (GAUZE/BANDAGES/DRESSINGS) ×1
BENZOIN TINCTURE PRP APPL 2/3 (GAUZE/BANDAGES/DRESSINGS) IMPLANT
CHLORAPREP W/TINT 26 (MISCELLANEOUS) ×2 IMPLANT
CLAMP UMBILICAL CORD (MISCELLANEOUS) ×1 IMPLANT
CLOTH BEACON ORANGE TIMEOUT ST (SAFETY) ×1 IMPLANT
DRSG OPSITE POSTOP 4X10 (GAUZE/BANDAGES/DRESSINGS) ×1 IMPLANT
ELECT REM PT RETURN 9FT ADLT (ELECTROSURGICAL) ×1
ELECTRODE REM PT RTRN 9FT ADLT (ELECTROSURGICAL) ×1 IMPLANT
EXTRACTOR VACUUM KIWI (MISCELLANEOUS) IMPLANT
EXTRACTOR VACUUM M CUP 4 TUBE (SUCTIONS) IMPLANT
GLOVE BIO SURGEON STRL SZ7 (GLOVE) ×1 IMPLANT
GLOVE BIOGEL PI IND STRL 7.0 (GLOVE) ×1 IMPLANT
GOWN STRL REUS W/TWL LRG LVL3 (GOWN DISPOSABLE) ×2 IMPLANT
KIT ABG SYR 3ML LUER SLIP (SYRINGE) IMPLANT
MAT PREVALON FULL STRYKER (MISCELLANEOUS) IMPLANT
NDL HYPO 25X5/8 SAFETYGLIDE (NEEDLE) IMPLANT
NEEDLE HYPO 25X5/8 SAFETYGLIDE (NEEDLE) IMPLANT
NS IRRIG 1000ML POUR BTL (IV SOLUTION) ×1 IMPLANT
PACK C SECTION WH (CUSTOM PROCEDURE TRAY) ×1 IMPLANT
PAD OB MATERNITY 4.3X12.25 (PERSONAL CARE ITEMS) ×1 IMPLANT
RTRCTR C-SECT PINK 25CM LRG (MISCELLANEOUS) IMPLANT
STRIP CLOSURE SKIN 1/2X4 (GAUZE/BANDAGES/DRESSINGS) IMPLANT
SUT MNCRL 0 VIOLET CTX 36 (SUTURE) ×2 IMPLANT
SUT PLAIN 0 NONE (SUTURE) IMPLANT
SUT PLAIN 2 0 (SUTURE) ×1
SUT PLAIN ABS 2-0 CT1 27XMFL (SUTURE) IMPLANT
SUT VIC AB 0 CT1 27 (SUTURE) ×2
SUT VIC AB 0 CT1 27XBRD ANBCTR (SUTURE) ×2 IMPLANT
SUT VIC AB 2-0 CT1 27 (SUTURE) ×1
SUT VIC AB 2-0 CT1 TAPERPNT 27 (SUTURE) ×1 IMPLANT
SUT VIC AB 2-0 SH 27 (SUTURE) ×2
SUT VIC AB 2-0 SH 27XBRD (SUTURE) ×2 IMPLANT
SUT VIC AB 4-0 KS 27 (SUTURE) ×1 IMPLANT
SUT VICRYL 0 TIES 12 18 (SUTURE) IMPLANT
TOWEL OR 17X24 6PK STRL BLUE (TOWEL DISPOSABLE) ×1 IMPLANT
TRAY FOLEY W/BAG SLVR 14FR LF (SET/KITS/TRAYS/PACK) IMPLANT
WATER STERILE IRR 1000ML POUR (IV SOLUTION) ×1 IMPLANT

## 2023-01-12 NOTE — Progress Notes (Signed)
Labor Progress Note  S/O: Pt reports feeling some of the ctxs more but still comfortable with epidural  Vitals:   01/12/23 1301 01/12/23 1331  BP: 104/61 116/70  Pulse: 91 93  Resp:    Temp:    SpO2:      SVE: 7/80/0  EFM: cat II for intermittent variable decels with baseline 130 bpm moderate variability and accels IUPC: ctxs q 2-4 min with MVU ranging from 60-110s  A/P: 32Y G3P1011 @ 39.6 TOLAC   -TOLAC: SROM x23hrs, s/p Foley balloon, currently on Pitocin of 5mU with inadequate ctx pattern, but IUPC seems to not be tracing appropriately, so IUPC replaced. Will increase the Pitocin until ctxs adequate. Reviewed with patient if no cervical change with adequate ctxs, will need to proceed with cesarean section -Cont EFM/Toco: Cat II for intermittent variables but quiickly recovers without intervention and overall reassuring with moderate variability and accels present, cont to assess -GBS NEG -Epidural labor pain mgmt -Routine intrapartum care -Workings towards VBAC but MOD guarded   A  01/12/23 2:22 PM

## 2023-01-12 NOTE — Anesthesia Procedure Notes (Signed)
Epidural Patient location during procedure: OB Start time: 01/12/2023 1:09 AM End time: 01/12/2023 1:12 AM  Staffing Anesthesiologist: Kaylyn Layer, MD Performed: anesthesiologist   Preanesthetic Checklist Completed: patient identified, IV checked, risks and benefits discussed, monitors and equipment checked, pre-op evaluation and timeout performed  Epidural Patient position: sitting Prep: DuraPrep and site prepped and draped Patient monitoring: continuous pulse ox, blood pressure and heart rate Approach: midline Location: L3-L4 Injection technique: LOR air  Needle:  Needle type: Tuohy  Needle gauge: 17 G Needle length: 9 cm Needle insertion depth: 6 cm Catheter type: closed end flexible Catheter size: 19 Gauge Catheter at skin depth: 11 cm Test dose: negative and Other (1% lidocaine)  Assessment Events: blood not aspirated, no cerebrospinal fluid, injection not painful, no injection resistance, no paresthesia and negative IV test  Additional Notes Patient identified. Risks, benefits, and alternatives discussed with patient including but not limited to bleeding, infection, nerve damage, paralysis, failed block, incomplete pain control, headache, blood pressure changes, nausea, vomiting, reactions to medication, itching, and postpartum back pain. Confirmed with bedside nurse the patient's most recent platelet count. Confirmed with patient that they are not currently taking any anticoagulation, have any bleeding history, or any family history of bleeding disorders. Patient expressed understanding and wished to proceed. All questions were answered. Sterile technique was used throughout the entire procedure. Please see nursing notes for vital signs.   Crisp LOR on first pass. Test dose was given through epidural catheter and negative prior to continuing to dose epidural or start infusion. Warning signs of high block given to the patient including shortness of breath, tingling/numbness  in hands, complete motor block, or any concerning symptoms with instructions to call for help. Patient was given instructions on fall risk and not to get out of bed. All questions and concerns addressed with instructions to call with any issues or inadequate analgesia.  Reason for block:procedure for pain

## 2023-01-12 NOTE — Op Note (Signed)
Janet Norman 02-26-1991 [id number]  OPERATIVE NOTE  PROCEDURE: repeat low transverse cesarean section  PRE-OPERATIVE DIAGNOSIS:  Single intrauterine pregnancy at 40 weeks 0 days Non-reassuring fetal heart tracing Failed trial of labor after cesarean section  POST-OPERATIVE DIAGNOSIS: As above, delivered  SURGEON:  , DO   ASSISTANT: None  FINDINGS: single viable healthy female infant in the ROA presentation apgars 8 and 9 weight 7 pounds 13 ounces; significant bladder adhesions tacked up to the left upper aspect of the uterus taken down to access the lower uterine segment. No uterine scar dehiscence or window noted however the inferior aspect of the lower uterine segment was distended  EBL: 615 cc  FLUIDS: 1,000 cc LR  MEDICATIONS: Ancef 2 g IV, Azithromycin 500 mg IV  URINE OUTPUT: 200 cc clear  COMPLICATIONS: none  PROCEDURE IN DETAIL:  After the patient was appropriately consented she was taken to the operating room where regional anesthesia was obtained without complications. The patient was placed in the dorsal supine position with leftward tilt. Fetal heart tones were obtained and found to be reassuring. A Foley catheter had previously been placed and remained in place throughout the surgery. The patient was prepped and draped in the usual sterile fashion. An appropriate time out was performed that verified the correct patient, procedure, and surgical team.   The scalpel was used to make a low transverse skin incision. The incision was carried down to the fascia, maintaining hemostasis with the Bovie as needed, The fascia was incised to the left and the right of the midline. The fascia incision was carried laterally using the curved Mayo scissors on either side. The inferior aspect of the fascia was grasped with the Kocher clamps, tented upwards, and the underlying rectus muscle dissected off bluntly and sharply with curved Mayo scissors. The Kocher  clamps were removed, placed on the superior aspect of the fascia and the rectus muscles dissected off in a similar fashion. The rectus muscles were divided at the midline bluntly. The peritoneum was identified, grasped with hemostat clamps and entered sharply with the Metzenbaum scissors. The incision was then extended laterally by bluntly stretching. The Alexis retractor was introduced. There were significant bladder adhesions noted and taken down with Metzenbaum scissors  in order to access the lower uterine segment. The scalpel was used to make a low transverse uterine incision. The incision was extended caudad and cephalad by bluntly stretching. The amniotic membranes were ruptured for clear fluid. The infant was found to be in the cephalic presentation. The infant's head was delivered through the hysterotomy while maintaining adequate flexion at the neck. The infant's remaining shoulders and body were subsequently delivered without difficulties. The infant was dried, stimulated, and handed off to the awaiting neonatal team after 1 minute of delayed cord clamping. The placenta was delivered by manual extraction intact. The uterine cavity was cleared of any clot and debris. There was an extension noted on the right aspect of the hysterotomy which was closed with 0-Vicryl. The remaining hysterotomy was closed using 0-Vicryl in a running locking fashion. A second layer closure was performed using the same suture. Adequate hemostasis of the hysterotomy was noted. The bilateral gutters were cleared of all clot and debris. Bilateral tubes and ovaries were inspected and found to be within normal limits. The underlying fascia planes were inspected and found to be hemostatic. The fascia was closed with 0-Vicryl a normal running fashion. The subcutaneous layer was irrigated with sterile saline and hemostasis obtained with the  Bovie. The subcutaneous layer was closed with 2-0 plain. The skin layer was closed with 4-0  Vicryl. The patient tolerated the procedure well and was taken to the recovery room in stable condition. All instrument, needle, and sponge counts were correct.    A  01/12/23 7:16 PM

## 2023-01-12 NOTE — Progress Notes (Signed)
Labor Progress Note  S/O: Pt reports pressure with ctxs. Husband supportive at bedside  Vitals:   01/12/23 1606 01/12/23 1631  BP:  134/71  Pulse:  (!) 109  Resp:    Temp: 98.6 F (37 C)   SpO2:     SVE: 8/90/0  EFM: cat II for recurrent variables, moderate variability IUPC: inadequate ctxs  A/P: 32Y G3P1011 @ 40.0 TOLAC  Patient has had very low progress throughout the day was 7 cm at 9AM and is now 8 cm but high fetal station 0. Initially was having intermittent variable decelerations, has received amnioinfusion and continuous maternal repositioning, however now variables are recurrent and becoming deeper. I advised patient and husband of my concerns of slow progress and recurrent variable decelerations concerning for fetal intolerance to labor and recommend proceeding with repeat cesarean section at this time. Patient and husband agree. I consented patient for repeat cesarean section, including risks for additional bleeding, infection, damage to surrounding structures, VTE. Patient is agreeable to blood transfusion if clinically indicated. Consents signed at the bedside. OR team notified.   A  01/12/23 5:16 PM

## 2023-01-12 NOTE — Transfer of Care (Signed)
Immediate Anesthesia Transfer of Care Note  Patient: Janet Norman  Procedure(s) Performed: Repeat CESAREAN SECTION  Patient Location: PACU  Anesthesia Type:Epidural  Level of Consciousness: awake, alert , and oriented  Airway & Oxygen Therapy: Patient Spontanous Breathing  Post-op Assessment: Report given to RN and Post -op Vital signs reviewed and stable  Post vital signs: Reviewed and stable  Last Vitals:  Vitals Value Taken Time  BP    Temp    Pulse 116 01/12/23 1917  Resp    SpO2 100 % 01/12/23 1917  Vitals shown include unfiled device data.  Last Pain:  Vitals:   01/12/23 1836  TempSrc: Axillary  PainSc:       Patients Stated Pain Goal: 0 (01/11/23 1317)  Complications: No notable events documented.

## 2023-01-12 NOTE — Anesthesia Preprocedure Evaluation (Addendum)
Anesthesia Evaluation  Patient identified by MRN, date of birth, ID band Patient awake    Reviewed: Allergy & Precautions, Patient's Chart, lab work & pertinent test results  History of Anesthesia Complications Negative for: history of anesthetic complications  Airway Mallampati: II  TM Distance: >3 FB Neck ROM: Full    Dental no notable dental hx.    Pulmonary neg pulmonary ROS   Pulmonary exam normal        Cardiovascular negative cardio ROS Normal cardiovascular exam     Neuro/Psych negative neurological ROS  negative psych ROS   GI/Hepatic negative GI ROS, Neg liver ROS,,,  Endo/Other    Morbid obesity  Renal/GU negative Renal ROS  negative genitourinary   Musculoskeletal negative musculoskeletal ROS (+)    Abdominal   Peds  Hematology  (+) Blood dyscrasia, anemia   Anesthesia Other Findings Day of surgery medications reviewed with patient.  Reproductive/Obstetrics (+) Pregnancy (Hx of C/S x1)                             Anesthesia Physical Anesthesia Plan  ASA: 3  Anesthesia Plan: Epidural   Post-op Pain Management:    Induction:   PONV Risk Score and Plan: Treatment may vary due to age or medical condition  Airway Management Planned: Natural Airway  Additional Equipment: Fetal Monitoring  Intra-op Plan:   Post-operative Plan:   Informed Consent: I have reviewed the patients History and Physical, chart, labs and discussed the procedure including the risks, benefits and alternatives for the proposed anesthesia with the patient or authorized representative who has indicated his/her understanding and acceptance.       Plan Discussed with:   Anesthesia Plan Comments:        Anesthesia Quick Evaluation

## 2023-01-12 NOTE — Anesthesia Postprocedure Evaluation (Signed)
Anesthesia Post Note  Patient: Trent Theisen  Procedure(s) Performed: Repeat CESAREAN SECTION     Patient location during evaluation: PACU Anesthesia Type: Epidural Level of consciousness: awake and alert and oriented Pain management: pain level controlled Vital Signs Assessment: post-procedure vital signs reviewed and stable Respiratory status: spontaneous breathing, nonlabored ventilation and respiratory function stable Cardiovascular status: blood pressure returned to baseline and stable Postop Assessment: no headache, no backache, patient able to bend at knees and epidural receding Anesthetic complications: no   No notable events documented.  Last Vitals:  Vitals:   01/12/23 1945 01/12/23 2000  BP: 106/66 122/72  Pulse: 100 99  Resp: 17 15  Temp: 37.1 C   SpO2: 100% 99%    Last Pain:  Vitals:   01/12/23 1945  TempSrc: Oral  PainSc:    Pain Goal: Patients Stated Pain Goal: 0 (01/11/23 1317)  LLE Motor Response: Purposeful movement (01/12/23 1945) LLE Sensation: Numbness (01/12/23 1945) RLE Motor Response: Purposeful movement (01/12/23 1945) RLE Sensation: Numbness (01/12/23 1945)     Epidural/Spinal Function Cutaneous sensation: Able to Discern Pressure (01/12/23 1945), Patient able to flex knees: Yes (01/12/23 1945), Patient able to lift hips off bed: No (01/12/23 1945), Back pain beyond tenderness at insertion site: No (01/12/23 1945), Progressively worsening motor and/or sensory loss: No (01/12/23 1945), Bowel and/or bladder incontinence post epidural: No (01/12/23 1945)  Lannie Fields

## 2023-01-12 NOTE — Progress Notes (Signed)
Labor Progress Note   S/O: Pt comfortable with epidural, reports some rectal pressure   Vitals:   01/12/23 0730 01/12/23 0800  BP: 120/71 118/68  Pulse: 97 96  Resp:    Temp:    SpO2:      SVE: 6/80/-1   EFM: Cat I baseline 130 bpm mod var +accels, -decels Toco: ctxs q 2 min  A/P: 32Y G3P1011 @ 39.6 TOLAC   -TOLAC: SROM x23hrs, s/p Foley balloon, now on Pitocin of 4mU. Consented for IUPC placement--titrate Pitocin as needed -Cont EFM/Toco: Cat I -GBS NEG -Epidural labor pain mgmt -Routine intrapartum care -Workings towards VBAC   A  01/12/23 8:35 AM   .

## 2023-01-13 ENCOUNTER — Encounter (HOSPITAL_COMMUNITY): Payer: Self-pay | Admitting: Obstetrics and Gynecology

## 2023-01-13 LAB — CBC
HCT: 23.6 % — ABNORMAL LOW (ref 36.0–46.0)
Hemoglobin: 7.5 g/dL — ABNORMAL LOW (ref 12.0–15.0)
MCH: 25 pg — ABNORMAL LOW (ref 26.0–34.0)
MCHC: 31.8 g/dL (ref 30.0–36.0)
MCV: 78.7 fL — ABNORMAL LOW (ref 80.0–100.0)
Platelets: 202 10*3/uL (ref 150–400)
RBC: 3 MIL/uL — ABNORMAL LOW (ref 3.87–5.11)
RDW: 14.6 % (ref 11.5–15.5)
WBC: 19.3 10*3/uL — ABNORMAL HIGH (ref 4.0–10.5)
nRBC: 0 % (ref 0.0–0.2)

## 2023-01-13 MED ORDER — SENNOSIDES-DOCUSATE SODIUM 8.6-50 MG PO TABS
1.0000 | ORAL_TABLET | Freq: Two times a day (BID) | ORAL | Status: DC | PRN
  Administered 2023-01-13: 1 via ORAL
  Filled 2023-01-13: qty 1

## 2023-01-13 MED ORDER — ESCITALOPRAM OXALATE 10 MG PO TABS
10.0000 mg | ORAL_TABLET | Freq: Every day | ORAL | Status: DC
  Administered 2023-01-13: 10 mg via ORAL
  Filled 2023-01-13: qty 1

## 2023-01-13 MED ORDER — IBUPROFEN 600 MG PO TABS
600.0000 mg | ORAL_TABLET | Freq: Four times a day (QID) | ORAL | Status: DC
  Administered 2023-01-13 – 2023-01-14 (×5): 600 mg via ORAL
  Filled 2023-01-13 (×5): qty 1

## 2023-01-13 MED ORDER — ESCITALOPRAM OXALATE 10 MG PO TABS
5.0000 mg | ORAL_TABLET | Freq: Every day | ORAL | Status: DC
  Filled 2023-01-13: qty 1

## 2023-01-13 MED ORDER — POLYSACCHARIDE IRON COMPLEX 150 MG PO CAPS
150.0000 mg | ORAL_CAPSULE | Freq: Every day | ORAL | Status: DC
  Administered 2023-01-13 – 2023-01-14 (×2): 150 mg via ORAL
  Filled 2023-01-13 (×2): qty 1

## 2023-01-13 NOTE — Lactation Note (Signed)
This note was copied from a baby's chart. Lactation Consultation Note  Patient Name: Janet Norman [id number] Date: 01/13/2023 Age:30 hours Reason for consult: Initial assessment;Term;Infant weight loss (Infant had weight loss of -2.65%).  P2, term female infant, Per Birth Parent infant is latching well but different that with her son, infant is breastfeeding 15 to 30 minutes most feedings. LC entered the room, Birth Parent was using her Spectra DEBP, LC asked Birth Parent to decrease pumping feature down from 11 to 5 to prevent soreness and limit pumping to maybe 15 minutes to avoid over pumping. LC did not observe latch due infant recently BF for 15 minutes at 17:45 pm and currently asleep in Support Person's arms. It is Birth Parent choice to pump. Per Birth Parent, earlier she pumped about 8 mls that was given to infant using a curve tip syringe.LC discussed infant's input and output and Per Support Person, infant had 7 stools and voided once in recovery. LC discussed the importance of maternal rest, diet and hydration. Birth Parent was made aware of O/P services, breastfeeding support groups, community resources, and our phone # for post-discharge questions.    Current feeding plan: 1- Birth Parent will continue to BF infant by cues, on demand, 8 to 12+ times within 24 hours, skin to skin. Birth Parent knows to call RN/LC for latch assistance if needed. 2-Birth Parent plan ( her choice) is to use per Personal DEBP every 3 or 4 hours and give infant back her EBM. Maternal Data Has patient been taught Hand Expression?: Yes Does the patient have breastfeeding experience prior to this delivery?: Yes How long did the patient breastfeed?: Per Birth Parent, she breastfeed and pumped with her 1st child who was 37weeks and  he weight less than 5 lbs at birth , she BF infant for 6 weeks,  and he is currently 30 years old.  Feeding Mother's Current Feeding Choice: Breast Milk  LATCH Score  LC did not  observe latch with current feeding.                  Lactation Tools Discussed/Used Tools: Other (comment);Pump (Birth Parent is pumping with her Personal DEBP ( Spectra)) Breast pump type: Double-Electric Breast Pump Pump Education: Setup, frequency, and cleaning;Milk Storage Reason for Pumping: Birth Parent choice to pump, she is pumping with her Personal DEBP Acupuncturist) Pumping frequency: Birth Parent is pumping at her on choice.  Interventions Interventions: Breast feeding basics reviewed;Skin to skin;Pace feeding;LC Services brochure;DEBP  Discharge Pump: DEBP;Personal  Consult Status Consult Status: Follow-up Date: 01/14/23 Follow-up type: In-patient    Janet Norman 01/13/2023, 6:27 PM

## 2023-01-13 NOTE — Progress Notes (Signed)
MOB was referred for history of anxiety. * Referral screened out by Clinical Social Worker because none of the following criteria appear to apply: ~ History of anxiety/depression during this pregnancy, or of post-partum depression following prior delivery. ~ Diagnosis of anxiety and/or depression within last 3 years OR * MOB's symptoms currently being treated with medication and/or therapy. MOB takes Lexapro 5mg  daily.  Please contact the Clinical Social Worker if needs arise, by [employer] request, or if MOB scores greater than 9/yes to question 10 on Edinburgh Postpartum Depression Screen.  Signed,  Norberto Sorenson, MSW, LCSWA, LCASA 2022-09-07 11:24 AM

## 2023-01-13 NOTE — Progress Notes (Signed)
Subjective: Postpartum Day One: Cesarean Delivery Patient reports feeling well. States pain has been manageable with PO pain medications. IV access lost last night but does not want to have replaced due to being a difficult stick. She has been ambulating without dizziness. VB has been very light. Spontaneously voiding without difficulties. Has not passed gas yet. Tolerating regular diet without N/V. No HA, CP, or SOB.    Baby girl doing well at bedside, BF going well.     Objective: Patient Vitals for the past 24 hrs:  BP Temp Temp src Pulse Resp SpO2  01/13/23 0600 (!) 112/59 98 F (36.7 C) -- 77 18 98 %  01/13/23 0100 112/64 98.3 F (36.8 C) Oral 86 18 98 %  01/12/23 2300 (!) 102/58 -- -- 90 18 97 %  01/12/23 2256 113/60 98.5 F (36.9 C) Oral 93 18 97 %  01/12/23 2150 120/63 98.6 F (37 C) -- (!) 101 20 100 %  01/12/23 2040 121/65 99 F (37.2 C) Oral 99 20 98 %  01/12/23 2015 117/75 -- -- 98 18 98 %  01/12/23 2000 122/72 -- -- 99 15 99 %  01/12/23 1945 106/66 98.8 F (37.1 C) Oral 100 17 100 %  01/12/23 1933 -- -- -- (!) 110 16 99 %  01/12/23 1930 120/69 -- -- (!) 124 18 100 %  01/12/23 1921 (!) 115/58 98.5 F (36.9 C) Oral (!) 123 20 100 %  01/12/23 1836 -- 98.4 F (36.9 C) Axillary (!) 168 (!) 40 --  01/12/23 1731 122/74 -- -- (!) 106 -- --  01/12/23 1631 134/71 -- -- (!) 109 -- --  01/12/23 1606 -- 98.6 F (37 C) Axillary -- -- --  01/12/23 1604 128/81 -- -- 99 -- --  01/12/23 1555 119/67 -- -- (!) 105 -- --  01/12/23 1431 119/78 -- -- 99 -- --  01/12/23 1400 124/71 -- -- (!) 108 -- --  01/12/23 1331 116/70 -- -- 93 -- --  01/12/23 1301 104/61 -- -- 91 -- --  01/12/23 1300 -- (!) 97.4 F (36.3 C) Axillary -- -- --  01/12/23 1232 (!) 116/52 -- -- (!) 109 -- --  01/12/23 1226 -- -- -- -- -- 100 %  01/12/23 1200 133/80 -- -- 97 -- 100 %  01/12/23 1130 (!) 142/83 -- -- (!) 105 -- --    Intake/Output Summary (Last 24 hours) at 01/13/2023 1108 Last data filed at  01/13/2023 2956 Gross per 24 hour  Intake 1400 ml  Output 4690 ml  Net -3290 ml     Physical Exam:  General: alert and no distress Lochia: appropriate Uterine Fundus: firm Incision: healing well DVT Evaluation: No evidence of DVT seen on physical exam.  Recent Labs    01/11/23 1347 01/13/23 0514  HGB 9.7* 7.5*  HCT 29.8* 23.6*    Assessment/Plan: Status post Cesarean section. Doing well postoperatively.  Continue current care. Myrtis Maille Rennie Plowman [phone] POD#1 sp repeat cesarean section at [redacted]w[redacted]d, failed TOLAC NRFHT 1. PPC: cont current PO pain regimen, regular diet, encourage ambulation 2. Cute on chronic blood loss anemia: expected for EBL, Hgb 7.5 this morning but patient is asymptomatic and VB is minimal. We discussed benefits of IV Fe therapy, however pt does not have IV access and requests to avoid replacing IV at this time d/t being difficult stick; will start with PO Fe and Fe rich diet, she agrees to have IV replaced if she becomes symptomatic 3. Hx PEC prior pregnancy:  normotensive this pregnancy and PP BP WNL 4. RH POS, Rubella Imm 5. LC PRN  Continue inpatient care, anticipate DC Home tomorrow pending baby's DC status   A  01/13/2023, 11:06 AM

## 2023-01-14 ENCOUNTER — Inpatient Hospital Stay (HOSPITAL_COMMUNITY)
Admission: RE | Admit: 2023-01-14 | Discharge: 2023-01-14 | Disposition: A | Discharge status: Home / Self Care | Payer: BC Managed Care – PPO | Source: Ambulatory Visit

## 2023-01-14 HISTORY — DX: Supervision of pregnancy with other poor reproductive or obstetric history, unspecified trimester: O09.299

## 2023-01-14 MED ORDER — IBUPROFEN 600 MG PO TABS: 600.0000 mg | ORAL_TABLET | Freq: Four times a day (QID) | ORAL | 0 refills | Status: AC

## 2023-01-14 MED ORDER — OXYCODONE HCL 5 MG PO TABS: 5.0000 mg | ORAL_TABLET | ORAL | 0 refills | Status: AC | PRN

## 2023-01-14 MED ORDER — FERROUS SULFATE 325 (65 FE) MG PO TBEC: 325.0000 mg | DELAYED_RELEASE_TABLET | Freq: Three times a day (TID) | ORAL | 3 refills | Status: AC

## 2023-01-14 NOTE — Progress Notes (Signed)
No c/o; pain controlled, voids w/o difficulty, tol po, ambulating No dizziness, not lightheaded; +flatus Nml lochia Breastfeeding  Patient Vitals for the past 24 hrs:  BP Temp Temp src Pulse Resp SpO2  01/14/23 0603 100/78 98 F (36.7 C) -- 67 18 100 %  01/13/23 2208 128/84 98.2 F (36.8 C) Oral 84 18 100 %  01/13/23 1359 118/75 98.1 F (36.7 C) Oral 90 17 --   A&ox3 Rrr Ctab Abd: +bs, soft,nt,nd; fundus firm and below umb; dressing c/d/I LE: trace edema, nt bilat     Latest Ref Rng & Units 01/13/2023    5:14 AM 01/11/2023    1:47 PM 12/05/2020    8:06 AM  CBC  WBC 4.0 - 10.5 K/uL 19.3  10.9  8.5   Hemoglobin 12.0 - 15.0 g/dL 7.5  9.7  40.9   Hematocrit 36.0 - 46.0 % 23.6  29.8  31.6   Platelets 150 - 400 K/uL 202  270  362    A/P: pod2 s/p rltcs - failed tolac, nrfht Doing well, contin care; wants d/c home today Anemia d/t acute blood loss - asymptomatic; declined iv iron, plans oral iron tid RH pos RI Anxiety - lexapro, stable; plan 2 wk pp f/u

## 2023-01-14 NOTE — Discharge Summary (Signed)
Postpartum Discharge Summary  Date of Service updated     Patient Name: Janet Norman DOB: [birth year] MRN: [id number]  Date of admission: 01/11/2023 Delivery date:01/12/2023 Delivering provider: Clance Boll A Date of discharge: 01/14/2023  Admitting diagnosis: Normal labor and delivery [O80] Previous cesarean section [Z98.891] Intrauterine pregnancy: [redacted]w[redacted]d     Secondary diagnosis:  Principal Problem:   Postpartum care following cesarean delivery 8/3 Active Problems:   Normal labor and delivery   Failed trial of labor following previous cesarean, delivered   Previous cesarean section  Additional problems: anemia    Discharge diagnosis: Term Pregnancy Delivered                                               Hospital course: Induction of Labor With Cesarean Section   32 y.o. yo [id number] at [redacted]w[redacted]d was admitted to the hospital 01/11/2023 for induction of labor. Patient had a labor course significant for failed TOLAC. The patient went for cesarean section due to  failed tolac . Delivery details are as follows: Membrane Rupture Time/Date: 9:30 AM,01/11/2023  Delivery Method:C-Section, Low Transverse Details of operation can be found in separate operative Note.  Patient had a postpartum course complicated by severe anemia - pt declined IV iron, tolerating oral iron and asymptomatic. She is ambulating, tolerating a regular diet, passing flatus, and urinating well.  Patient is discharged home in stable condition on 01/14/23.      Newborn Data: Birth date:01/12/2023 Birth time:6:11 PM Gender:Female Living status:Living Apgars:8 ,9  Weight:3550 g                                Physical exam  Vitals:   01/13/23 0600 01/13/23 1359 01/13/23 2208 01/14/23 0603  BP: (!) 112/59 118/75 128/84 100/78  Pulse: 77 90 84 67  Resp: 18 17 18 18   Temp: 98 F (36.7 C) 98.1 F (36.7 C) 98.2 F (36.8 C) 98 F (36.7 C)  TempSrc:  Oral Oral   SpO2: 98%  100% 100%  Weight:      Height:        General: alert, cooperative, and no distress Lochia: appropriate Uterine Fundus: firm Incision: Dressing is clean, dry, and intact DVT Evaluation: No evidence of DVT seen on physical exam. Labs: Lab Results  Component Value Date   WBC 19.3 (H) 01/13/2023   HGB 7.5 (L) 01/13/2023   HCT 23.6 (L) 01/13/2023   MCV 78.7 (L) 01/13/2023   PLT 202 01/13/2023      Latest Ref Rng & Units 12/05/2020    8:06 AM  CMP  Glucose 70 - 99 mg/dL 147   BUN 6 - 20 mg/dL 17   Creatinine 8.29 - 1.00 mg/dL 5.62   Sodium 130 - 865 mmol/L 135   Potassium 3.5 - 5.1 mmol/L 3.7   Chloride 98 - 111 mmol/L 102   CO2 22 - 32 mmol/L 24   Calcium 8.9 - 10.3 mg/dL 9.0   Total Protein 6.5 - 8.1 g/dL 7.6   Total Bilirubin 0.3 - 1.2 mg/dL 0.2   Alkaline Phos 38 - 126 U/L 70   AST 15 - 41 U/L 22   ALT 0 - 44 U/L 22    Edinburgh Score:    01/13/2023   11:37 PM  Inocente Salles  Postnatal Depression Scale Screening Tool  I have been able to laugh and see the funny side of things. 0  I have looked forward with enjoyment to things. 0  I have blamed myself unnecessarily when things went wrong. 1  I have been anxious or worried for no good reason. 1  I have felt scared or panicky for no good reason. 1  Things have been getting on top of me. 0  I have been so unhappy that I have had difficulty sleeping. 0  I have felt sad or miserable. 0  I have been so unhappy that I have been crying. 1  The thought of harming myself has occurred to me. 0  Edinburgh Postnatal Depression Scale Total 4      After visit meds:  Allergies as of 01/14/2023       Reactions   Red Dye Hives   Husband states reaction limited to food and drinks   Prednisone Hives, Rash   Sudafed [pseudoephedrine] Palpitations        Medication List     STOP taking these medications    aspirin EC 81 MG tablet       TAKE these medications    acetaminophen 500 MG tablet Commonly known as: TYLENOL Take 2 tablets (1,000 mg total) by mouth  every 6 (six) hours. What changed:  when to take this reasons to take this   escitalopram 5 MG tablet Commonly known as: LEXAPRO Take 5 mg by mouth daily.   ferrous sulfate 325 (65 FE) MG EC tablet Take 1 tablet (325 mg total) by mouth 3 (three) times daily with meals.   ibuprofen 600 MG tablet Commonly known as: ADVIL Take 1 tablet (600 mg total) by mouth every 6 (six) hours.   oxyCODONE 5 MG immediate release tablet Commonly known as: Oxy IR/ROXICODONE Take 1-2 tablets (5-10 mg total) by mouth every 4 (four) hours as needed for moderate pain.   PRENATAL PO Take 1 tablet by mouth daily.         Discharge home in stable condition Infant Feeding: Breast Infant Disposition:home with mother Discharge instruction: per After Visit Summary and Postpartum booklet. Activity: Advance as tolerated. Pelvic rest for 6 weeks.  Diet: routine diet Anticipated Birth Control: Unsure Postpartum Appointment:6 weeks Additional Postpartum F/U: Postpartum Depression checkup 2 wk and f/u anemia Future Appointments:No future appointments. Follow up Visit:      01/14/2023 Vick Frees, MD

## 2023-01-15 ENCOUNTER — Inpatient Hospital Stay (HOSPITAL_COMMUNITY)
Admission: AD | Admit: 2023-01-15 | Payer: BC Managed Care – PPO | Source: Home / Self Care | Admitting: Obstetrics & Gynecology

## 2023-01-27 ENCOUNTER — Encounter (HOSPITAL_COMMUNITY): Payer: Self-pay | Admitting: Obstetrics & Gynecology

## 2023-02-06 ENCOUNTER — Telehealth (HOSPITAL_COMMUNITY): Payer: Self-pay | Admitting: *Deleted

## 2023-02-06 NOTE — Telephone Encounter (Signed)
02/06/2023  Name: Janet Norman MRN: [id number] DOB: 09/01/1990  Reason for Call:  Transition of Care Hospital Discharge Call  Contact Status: Patient Contact Status: Complete  Language assistant needed:          Follow-Up Questions: Do You Have Any Concerns About Your Health As You Heal From Delivery?: No Do You Have Any Concerns About Your Infants Health?: No  Edinburgh Postnatal Depression Scale:  In the Past 7 Days:    PHQ2-9 Depression Scale:     Discharge Follow-up: Edinburgh score requires follow up?:  (patient declines screening today, has already seen her OB for a mood check and is taking medication, she reports feeling emotionally stable at this time)  Post-discharge interventions: Reviewed Newborn Safe Sleep Practices  Salena Saner, RN 02/06/2023 15:22

## 2024-04-08 ENCOUNTER — Ambulatory Visit
